# Patient Record
Sex: Male | Born: 1950
Health system: Southern US, Community
[De-identification: ages and names within clinical notes are randomized; demographics above are authoritative.]

## PROBLEM LIST (undated history)

## (undated) DIAGNOSIS — E785 Hyperlipidemia, unspecified: Secondary | ICD-10-CM

## (undated) DIAGNOSIS — C61 Malignant neoplasm of prostate: Secondary | ICD-10-CM

## (undated) DIAGNOSIS — I10 Essential (primary) hypertension: Secondary | ICD-10-CM

## (undated) HISTORY — DX: Essential (primary) hypertension: I10

## (undated) HISTORY — DX: Malignant neoplasm of prostate: C61

## (undated) HISTORY — DX: Hyperlipidemia, unspecified: E78.5

## (undated) HISTORY — PX: PROSTATECTOMY: SHX69

---

## 2008-01-21 ENCOUNTER — Encounter: Admission: RE | Admit: 2008-01-21 | Discharge: 2008-01-21 | Payer: Self-pay | Admitting: Unknown Physician Specialty

## 2008-03-15 ENCOUNTER — Encounter: Admission: RE | Admit: 2008-03-15 | Discharge: 2008-03-15 | Payer: Self-pay | Admitting: Unknown Physician Specialty

## 2008-04-22 ENCOUNTER — Encounter: Admission: RE | Admit: 2008-04-22 | Discharge: 2008-04-22 | Payer: Self-pay | Admitting: Unknown Physician Specialty

## 2008-05-04 ENCOUNTER — Ambulatory Visit: Payer: Self-pay | Admitting: Emergency Medicine

## 2008-05-04 LAB — CONVERTED CEMR LAB
CO2: 30 meq/L (ref 19–32)
Calcium: 9.6 mg/dL (ref 8.4–10.5)
GFR calc non Af Amer: 73 mL/min
Potassium: 3.9 meq/L (ref 3.5–5.1)
Sodium: 141 meq/L (ref 135–145)

## 2008-05-05 ENCOUNTER — Encounter: Admission: RE | Admit: 2008-05-05 | Discharge: 2008-05-05 | Payer: Self-pay | Admitting: Emergency Medicine

## 2008-05-12 ENCOUNTER — Ambulatory Visit: Payer: Self-pay | Admitting: Emergency Medicine

## 2010-10-27 IMAGING — CR DG CHEST 2V
2 series · 2 of 2 positions shown · non-contrast
Comparison: None

CLINICAL DATA: Cough for several weeks, right anterior lateral rib
pain

CHEST - 2 VIEW

[view not recorded (1 of 2)]
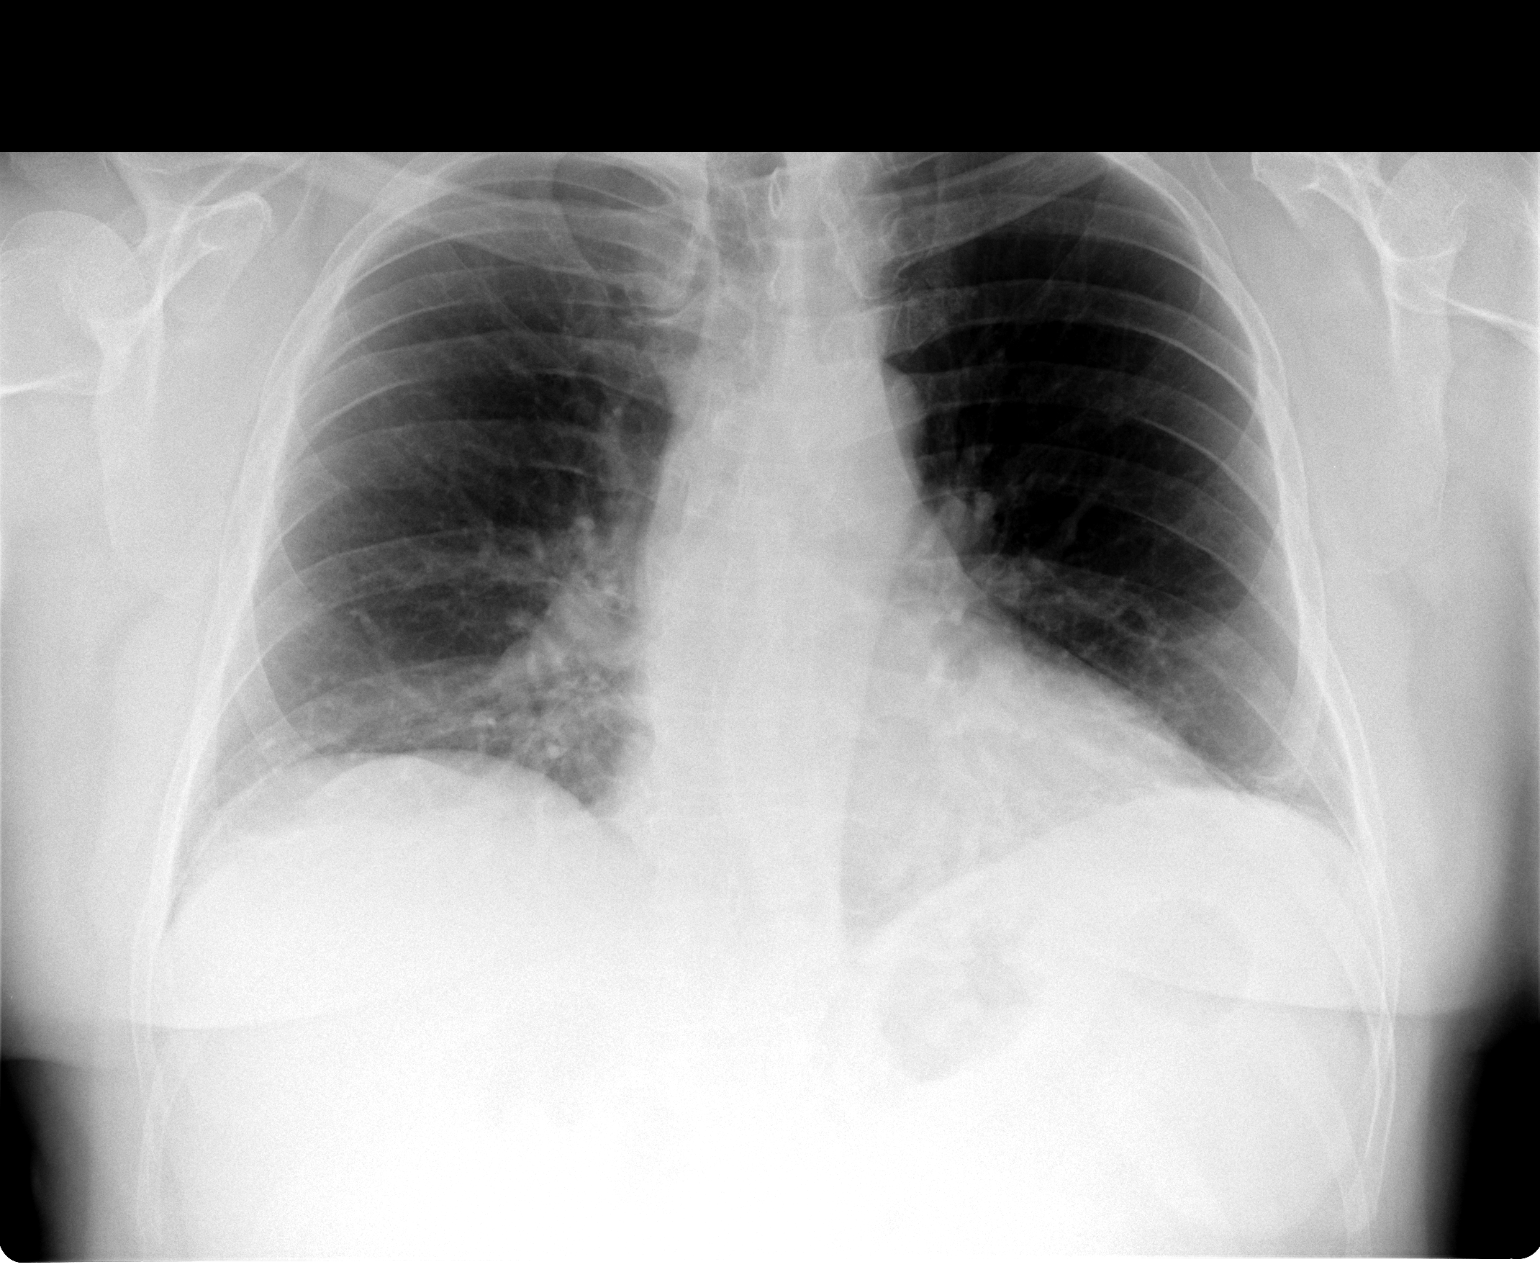

[view not recorded (2 of 2)]
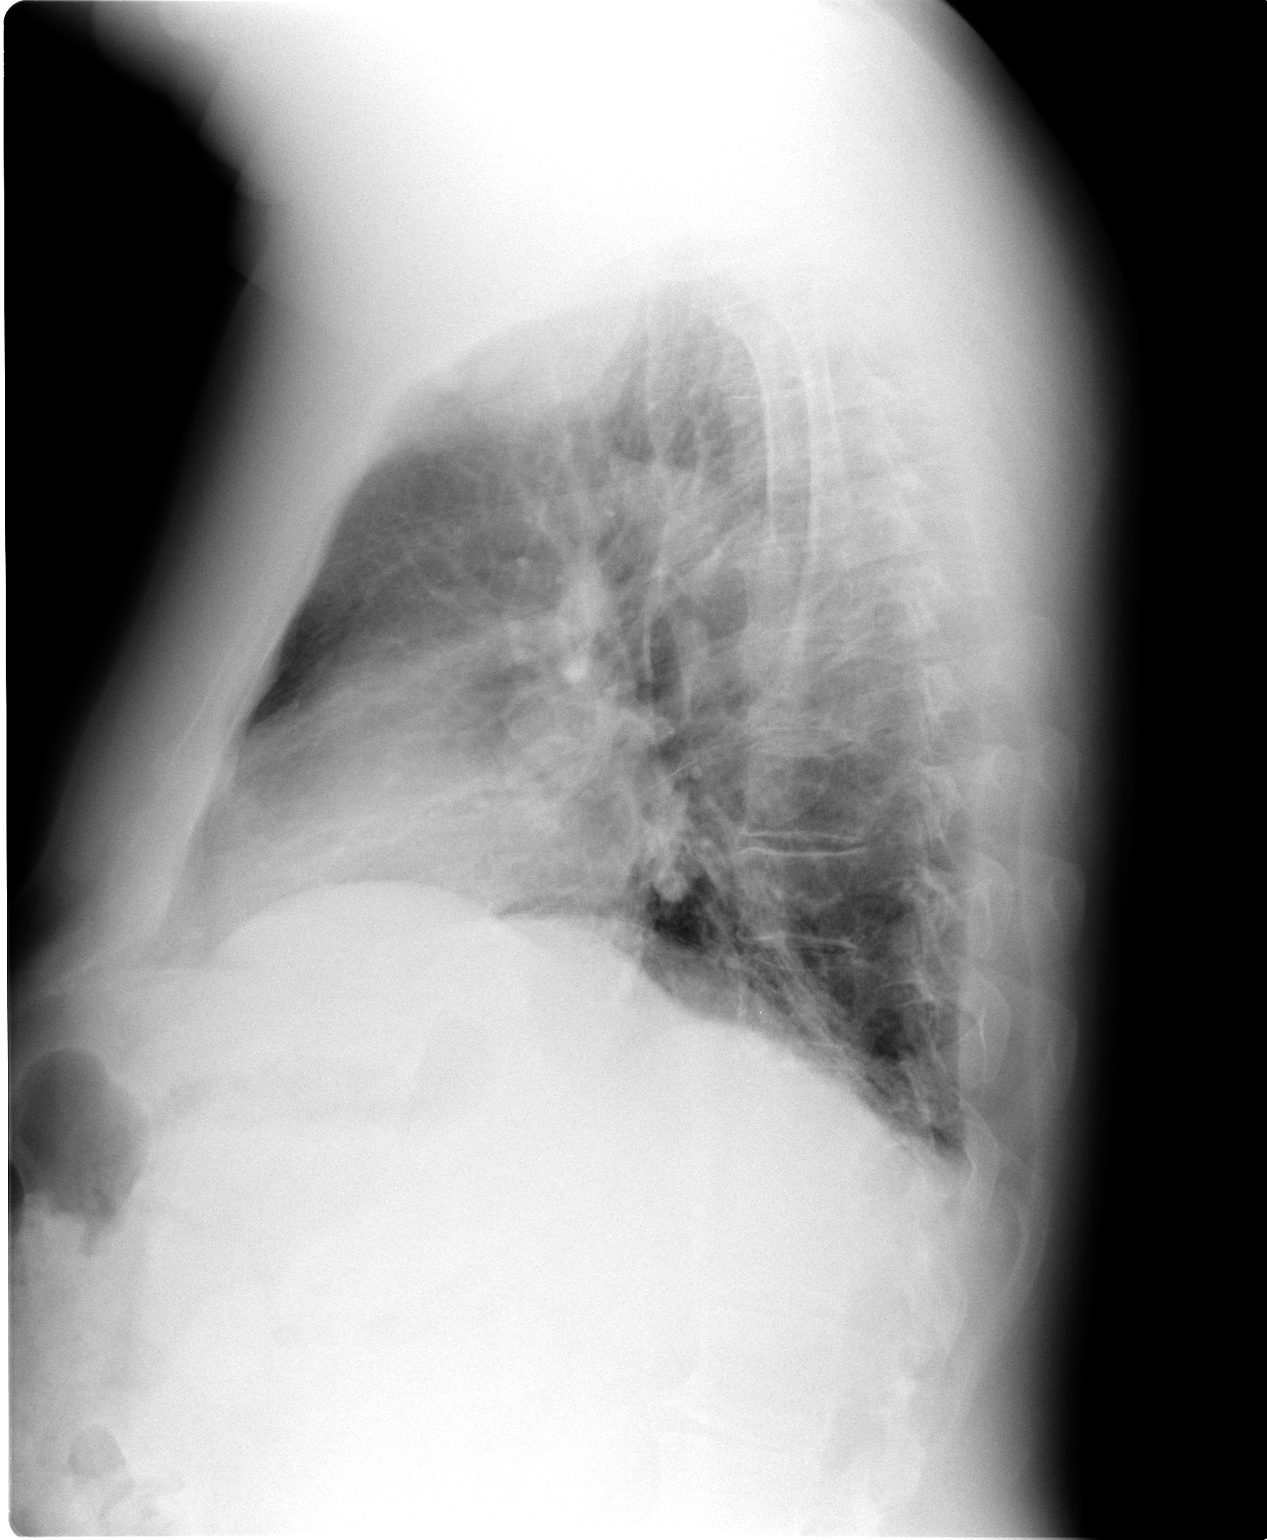

[2 of 2 positions shown; findings below may reference images not displayed]

FINDINGS: There are streaky markings posteriorly probably at the
left lung base.  Although these may be chronic in nature,
atelectasis or pneumonia cannot be excluded and follow-up chest x-
ray is recommended.  No pleural effusion is seen.  There is mild
cardiomegaly present.  No acute bony abnormality is noted.
IMPRESSION: Linear opacity posteriorly at the left lung base.  Consider
scarring, atelectasis, or pneumonia.  Follow-up chest x-ray may be
helpful.

## 2011-06-12 LAB — HEMOGLOBIN A1C: Hgb A1c MFr Bld: 5.6 % (ref 4.0–6.0)

## 2011-12-17 ENCOUNTER — Encounter: Payer: Self-pay | Admitting: Family Medicine

## 2011-12-17 ENCOUNTER — Ambulatory Visit (INDEPENDENT_AMBULATORY_CARE_PROVIDER_SITE_OTHER): Payer: BC Managed Care – PPO | Admitting: Family Medicine

## 2011-12-17 VITALS — BP 161/90 | HR 75 | Ht 66.0 in | Wt 204.0 lb

## 2011-12-17 DIAGNOSIS — Z23 Encounter for immunization: Secondary | ICD-10-CM

## 2011-12-17 DIAGNOSIS — J40 Bronchitis, not specified as acute or chronic: Secondary | ICD-10-CM

## 2011-12-17 DIAGNOSIS — Z8546 Personal history of malignant neoplasm of prostate: Secondary | ICD-10-CM

## 2011-12-17 DIAGNOSIS — Z298 Encounter for other specified prophylactic measures: Secondary | ICD-10-CM

## 2011-12-17 DIAGNOSIS — R0982 Postnasal drip: Secondary | ICD-10-CM | POA: Insufficient documentation

## 2011-12-17 MED ORDER — MOMETASONE FUROATE 50 MCG/ACT NA SUSP: 2.0000 | Freq: Every day | NASAL | Status: DC

## 2011-12-17 MED ORDER — ALBUTEROL SULFATE HFA 108 (90 BASE) MCG/ACT IN AERS: 2.0000 | INHALATION_SPRAY | Freq: Four times a day (QID) | RESPIRATORY_TRACT | Status: DC | PRN

## 2011-12-17 NOTE — Progress Notes (Signed)
CC: Blake Holden is a 61 y.o. male is here for Establish Care   Subjective: HPI:  Pleasant 61 year old here to establish care with acute complaint of a cough that's been present for a week that's is a nonproductive seems to be slightly improving but has been associated with wheezing. Denies fevers chills shortness of breath, orthopnea, nor chest pain. No interventions as of yet. Present 24 hours a day.  Past medical history significant for prostate cancer status post robotic prostatectomy. Denies unintentional weight loss. Managing erectile dysfunction with Cialis with good results.  Rare urinary incontinence with Valsalva does not get in the way of his quality of life. Denies gross hematuria or darkening of urine recently or remotely. Denies new musculoskeletal pain.  He is interested in losing weight has already made dietary changes but is unable to get in regular physical activity due to scheduling issues. he asks about hcg to aid in weight loss.  He believes his cholesterol was checked sometime around 6 months ago and is unsure about whether labs may have been obtained at that visit as well.  He notes that he did not take his blood pressure medication until minutes before the visit and is checking his blood pressure outside of our office in states the systolic today's reading is not characteristic of his daily blood pressure readings.   Review Of Systems Outlined In HPI  Past Medical History  Diagnosis Date  . Hypertension   . Hyperlipidemia   . Prostate cancer      Family History  Problem Relation Age of Onset  . Breast cancer Mother      History  Substance Use Topics  . Smoking status: Never Smoker   . Smokeless tobacco: Not on file  . Alcohol Use: Yes     Objective: Filed Vitals:   12/17/11 0913  BP: 161/90  Pulse: 75    General: Alert and Oriented, No Acute Distress HEENT: Pupils equal, round, reactive to light. Conjunctivae clear.  External ears unremarkable,  canals clear with intact TMs with appropriate landmarks.  Middle ear appears open without effusion. Pink inferior turbinates.  Moist mucous membranes, pharynx without inflammation nor lesions, moderate cobblestoning.  Neck supple without palpable lymphadenopathy nor abnormal masses. Lungs: Clear to auscultation bilaterally, no wheezing/ronchi/rales.  Comfortable work of breathing. Good air movement. Cardiac: Regular rate and rhythm. Normal S1/S2.  No murmurs, rubs, nor gallops.   Extremities: No peripheral edema.  Strong peripheral pulses.  Mental Status: No depression, anxiety, nor agitation. Skin: Warm and dry.  Assessment & Plan: Rual was seen today for establish care.  Diagnoses and associated orders for this visit:  Need for prophylactic immunotherapy - Flu vaccine greater than or equal to 3yo preservative free IM  History of prostate cancer - PSA  Bronchitis - albuterol (PROVENTIL HFA;VENTOLIN HFA) 108 (90 BASE) MCG/ACT inhaler; Inhale 2 puffs into the lungs every 6 (six) hours as needed for wheezing.  Postnasal drip - mometasone (NASONEX) 50 MCG/ACT nasal spray; Place 2 sprays into the nose daily.   We did PSA today is that over year since his last one progress report and his annual values have been less than 0.0 for years. He requests a albuterol for wheezing which I think is appropriate with his viral bronchitis that is resolving. Sounds like he is postnasal drip as well and was given Nasonex. We'll wait for outside labs for review prior to ordering labs before CPE in 3-4 weeks note that the patient has never had a colonoscopy  Return in about 3 weeks (around 01/07/2012).

## 2011-12-31 ENCOUNTER — Ambulatory Visit (INDEPENDENT_AMBULATORY_CARE_PROVIDER_SITE_OTHER): Payer: BC Managed Care – PPO | Admitting: Family Medicine

## 2011-12-31 ENCOUNTER — Encounter: Payer: Self-pay | Admitting: Family Medicine

## 2011-12-31 VITALS — BP 159/95 | HR 73 | Ht 66.0 in | Wt 206.0 lb

## 2011-12-31 DIAGNOSIS — Z0000 Encounter for general adult medical examination without abnormal findings: Secondary | ICD-10-CM

## 2011-12-31 DIAGNOSIS — J40 Bronchitis, not specified as acute or chronic: Secondary | ICD-10-CM

## 2011-12-31 DIAGNOSIS — I10 Essential (primary) hypertension: Secondary | ICD-10-CM

## 2011-12-31 LAB — BASIC METABOLIC PANEL WITH GFR
BUN: 23 mg/dL (ref 6–23)
CO2: 28 mEq/L (ref 19–32)
Calcium: 9.6 mg/dL (ref 8.4–10.5)
Chloride: 104 mEq/L (ref 96–112)
Potassium: 4.3 mEq/L (ref 3.5–5.3)
Sodium: 141 mEq/L (ref 135–145)

## 2011-12-31 LAB — LIPID PANEL
LDL Cholesterol: 112 mg/dL — ABNORMAL HIGH (ref 0–99)
Total CHOL/HDL Ratio: 4.4 Ratio
Triglycerides: 119 mg/dL (ref <150)
VLDL: 24 mg/dL (ref 0–40)

## 2011-12-31 MED ORDER — LOSARTAN POTASSIUM 100 MG PO TABS: 100.0000 mg | ORAL_TABLET | Freq: Every day | ORAL | Status: DC

## 2011-12-31 MED ORDER — ALBUTEROL SULFATE HFA 108 (90 BASE) MCG/ACT IN AERS: 2.0000 | INHALATION_SPRAY | Freq: Four times a day (QID) | RESPIRATORY_TRACT | Status: DC | PRN

## 2011-12-31 NOTE — Progress Notes (Signed)
CC: Blake Holden is a 61 y.o. male is here for Annual Exam  Colonoscopy: Patient refuses to have this done due to financial issues, we discussed the benefits of colonoscopy for the prevention and early detection of colon cancer, he will consider having this in the near future. Prostate: Discussed screening risks/beneifts with patient on 12/31/2011. PSA last week undetectable. AAA Screen: No smoking history not indicated DEXA: No known risk factors for osteopenia or osteoporosis Influenza Vaccine: Received 12/17/11 Pneumovax: No indication as of 12/31/2011 Td/Tdap: Believes within last 10 years Zoster: Not interested as of 12/31/2011    Subjective: HPI:  He has no acute complaints today  Blood pressure has been elevated in stage I hypertension today at his last visit.  He is trying to watch what he eats, he is just now getting back into a formal exercise program. He states his spirits are great denies depression, anxiety, mood disturbance  Review of Systems - General ROS: negative for - chills, fever, night sweats, weight gain or weight loss Ophthalmic ROS: negative for - decreased vision Psychological ROS: negative for - anxiety or depression ENT ROS: negative for - hearing change, nasal congestion, tinnitus or allergies Hematological and Lymphatic ROS: negative for - bleeding problems, bruising or swollen lymph nodes Breast ROS: negative Respiratory ROS: no cough, shortness of breath, or wheezing Cardiovascular ROS: no chest pain or dyspnea on exertion Gastrointestinal ROS: no abdominal pain, change in bowel habits, or black or bloody stools Genito-Urinary ROS: negative for - genital discharge, genital ulcers, incontinence or abnormal bleeding from genitals Musculoskeletal ROS: negative for - joint pain or muscle pain Neurological ROS: negative for - headaches or memory loss Dermatological ROS: negative for lumps, mole changes, rash and skin lesion changes  Past Medical History    Diagnosis Date  . Hypertension   . Hyperlipidemia   . Prostate cancer      Family History  Problem Relation Age of Onset  . Breast cancer Mother      History  Substance Use Topics  . Smoking status: Never Smoker   . Smokeless tobacco: Not on file  . Alcohol Use: Yes     Objective: Filed Vitals:   12/31/11 0912  BP: 159/95  Pulse: 73    General: No Acute Distress HEENT: Atraumatic, normocephalic, conjunctivae normal without scleral icterus.  No nasal discharge, hearing grossly intact, TMs with good landmarks bilaterally with no middle ear abnormalities, posterior pharynx clear without oral lesions. Neck: Supple, trachea midline, no cervical nor supraclavicular adenopathy. Pulmonary: Clear to auscultation bilaterally without wheezing, rhonchi, nor rales. Cardiac: Regular rate and rhythm.  No murmurs, rubs, nor gallops. No peripheral edema.  2+ peripheral pulses bilaterally. Abdomen: Bowel sounds normal.  No masses.  Non-tender without rebound.  Negative Murphy's sign. GU: Declined by patient MSK: Grossly intact, no signs of weakness.  Full strength throughout upper and lower extremities.  Full ROM in upper and lower extremities.  No midline spinal tenderness. Neuro: Gait unremarkable, CN II-XII grossly intact.  C5-C6 Reflex 2/4 Bilaterally, L4 Reflex 2/4 Bilaterally.  Cerebellar function intact. Skin: No rashes, numerous skin tags underneath the right axilla. Scattered actinic keratoses on the scalp and a few on the forearms bilaterally. Psych: Alert and oriented to person/place/time.  Thought process normal. No anxiety/depression.  Assessment & Plan: [name] was seen today for annual exam.  Diagnoses and associated orders for this visit:  Annual physical exam  Bronchitis - albuterol (PROVENTIL HFA;VENTOLIN HFA) 108 (90 BASE) MCG/ACT inhaler; Inhale 2 puffs into  the lungs every 6 (six) hours as needed for wheezing.  Routine health maintenance - Lipid panel - BASIC  METABOLIC PANEL WITH GFR  Essential hypertension - losartan (COZAAR) 100 MG tablet; Take 1 tablet (100 mg total) by mouth daily.     will increase his Cozaar, he will present for a nurse visit in approximately one week for blood pressure check. He's requested a cream is used in the past to treat actinic keratoses, he'll call us with the name of what he has used with success in the past. Labs as above, he tells me fasting sugars have been elevated in the past but is unsure of the exact numbers. I will call him with results tomorrow. He declines STI testing. We discussed healthy lifestyle options and a handout was given to him.  Return in about 3 months (around 04/01/2012).

## 2011-12-31 NOTE — Patient Instructions (Addendum)
Dr. Imajean Mcdermid's General Advice Following Your Complete Physical Exam  The Benefits of Regular Exercise: Unless you suffer from an uncontrolled cardiovascular condition, studies strongly suggest that regular exercise and physical activity will add to both the quality and length of your life.  The World Health Organization recommends 150 minutes of moderate intensity aerobic activity every week.  This is best split over 3-4 days a week, and can be as simple as a brisk walk for just over 35 minutes "most days of the week".  This type of exercise has been shown to lower LDL-Cholesterol, lower average blood sugars, lower blood pressure, lower cardiovascular disease risk, improve memory, and increase one's overall sense of wellbeing.  The addition of anaerobic (or "strength training") exercises offers additional benefits including but not limited to increased metabolism, prevention of osteoporosis, and improved overall cholesterol levels.  How Can I Strive For A Low-Fat Diet?: Current guidelines recommend that 25-35 percent of your daily energy (food) intake should come from fats.  One might ask how can this be achieved without having to dissect each meal on a daily basis?  Switch to skim or 1% milk instead of whole milk.  Focus on lean meats such as ground turkey, fresh fish, baked chicken, and lean cuts of beef as your source of dietary protein.  Consume less than 300mg/day of dietary cholesterol.  Limit trans fatty acid consumption primarily by limiting synthetic trans fats such as partially hydrogenated oils (Ex: fried fast foods).  Focus efforts on reducing your intake of "solid" fats (Ex: Butter).  Substitute olive or vegetable oil for solid fats where possible.  Moderation of Salt Intake: Provided you don't carry a diagnosis of congestive heart failure nor renal failure, I recommend a daily allowance of no more than 2300 mg of salt (sodium).  Keeping under this daily goal is associated with a  decreased risk of cardiovascular events, creeping above it can lead to elevated blood pressures and increases your risk of cardiovascular events.  Milligrams (mg) of salt is listed on all nutrition labels, and your daily intake can add up faster than you think.  Most canned and frozen dinners can pack in over half your daily salt allowance in one meal.    Lifestyle Health Risks: Certain lifestyle choices carry specific health risks.  As you may already know, tobacco use has been associated with increasing one's risk of cardiovascular disease, pulmonary disease, numerous cancers, among many other issues.  What you may not know is that there are medications and nicotine replacement strategies that can more than double your chances of successfully quitting.  I would be thrilled to help manage your quitting strategy if you currently use tobacco products.  When it comes to alcohol use, I've yet to find an "ideal" daily allowance.  Provided an individual does not have a medical condition that is exacerbated by alcohol consumption, general guidelines determine "safe drinking" as no more than two standard drinks for a man or no more than one standard drink for a male per day.  However, much debate still exists on whether any amount of alcohol consumption is technically "safe".  My general advice, keep alcohol consumption to a minimum for general health promotion.  If you or others believe that alcohol, tobacco, or recreational drug use is interfering with your life, I would be happy to provide confidential counseling regarding treatment options.  General "Over The Counter" Nutrition Advice: Postmenopausal women should aim for a daily calcium intake of 1200 mg, however a significant   portion of this might already be provided by diets including milk, yogurt, cheese, and other dairy products.  Vitamin D has been shown to help preserve bone density, prevent fatigue, and has even been shown to help reduce falls in the  elderly.  Ensuring a daily intake of 800 Units of Vitamin D is a good place to start to enjoy the above benefits, we can easily check your Vitamin D level to see if you'd potentially benefit from supplementation beyond 800 Units a day.  Folic Acid intake should be of particular concern to women of childbearing age.  Daily consumption of 400-800 mcg of Folic Acid is recommended to minimize the chance of spinal cord defects in a fetus should pregnancy occur.    For many adults, accidents still remain one of the most common culprits when it comes to cause of death.  Some of the simplest but most effective preventitive habits you can adopt include regular seatbelt use, proper helmet use, securing firearms, and regularly testing your smoke and carbon monoxide detectors.  Neelie Welshans B. Tanyah Debruyne DO [facility] [address], [address] [place], [place] [address] Phone: [phone]  

## 2012-01-01 ENCOUNTER — Telehealth: Payer: Self-pay | Admitting: *Deleted

## 2012-01-01 DIAGNOSIS — J329 Chronic sinusitis, unspecified: Secondary | ICD-10-CM

## 2012-01-01 MED ORDER — DOXYCYCLINE HYCLATE 100 MG PO TABS: ORAL_TABLET | ORAL | Status: AC

## 2012-01-01 NOTE — Telephone Encounter (Signed)
Pt called and states he is having left ear pain and ear feels clogged. Denies any fever.  Pt wants an abx called into the pharm. I did explain to pt that we typically do not rx abx without being evaluated but I would send a message to the provider since he was seen yesterday.Also let him know about urgent care since it is late in the day.Please advise if you would like to call something in for this pt. He uses [facility]

## 2012-01-01 NOTE — Telephone Encounter (Signed)
We discussed this briefly yesterday, I'm ok with sending in doxy (pcn allergy) to his pharmacy.

## 2012-01-03 ENCOUNTER — Encounter: Payer: Self-pay | Admitting: Family Medicine

## 2012-01-03 DIAGNOSIS — N529 Male erectile dysfunction, unspecified: Secondary | ICD-10-CM

## 2012-01-03 DIAGNOSIS — R739 Hyperglycemia, unspecified: Secondary | ICD-10-CM

## 2012-01-03 DIAGNOSIS — E785 Hyperlipidemia, unspecified: Secondary | ICD-10-CM

## 2012-01-15 ENCOUNTER — Other Ambulatory Visit: Payer: Self-pay | Admitting: *Deleted

## 2012-01-15 DIAGNOSIS — I10 Essential (primary) hypertension: Secondary | ICD-10-CM

## 2012-01-15 MED ORDER — LOSARTAN POTASSIUM 100 MG PO TABS: 100.0000 mg | ORAL_TABLET | Freq: Every day | ORAL | Status: DC

## 2012-01-15 MED ORDER — SIMVASTATIN 40 MG PO TABS: 40.0000 mg | ORAL_TABLET | Freq: Every evening | ORAL | Status: DC

## 2012-01-15 MED ORDER — DILTIAZEM HCL ER BEADS 300 MG PO CP24: 300.0000 mg | ORAL_CAPSULE | Freq: Every day | ORAL | Status: DC

## 2012-03-24 ENCOUNTER — Ambulatory Visit: Payer: BC Managed Care – PPO | Admitting: Family Medicine

## 2012-03-24 DIAGNOSIS — Z0289 Encounter for other administrative examinations: Secondary | ICD-10-CM

## 2012-04-15 ENCOUNTER — Other Ambulatory Visit: Payer: Self-pay | Admitting: Family Medicine

## 2012-04-22 ENCOUNTER — Other Ambulatory Visit: Payer: Self-pay | Admitting: Family Medicine

## 2012-04-22 DIAGNOSIS — N529 Male erectile dysfunction, unspecified: Secondary | ICD-10-CM

## 2012-05-08 ENCOUNTER — Ambulatory Visit (INDEPENDENT_AMBULATORY_CARE_PROVIDER_SITE_OTHER): Payer: BC Managed Care – PPO | Admitting: Family Medicine

## 2012-05-08 ENCOUNTER — Encounter: Payer: Self-pay | Admitting: Family Medicine

## 2012-05-08 VITALS — BP 160/95 | HR 78 | Temp 98.3°F | Ht 66.0 in | Wt 205.0 lb

## 2012-05-08 DIAGNOSIS — H6980 Other specified disorders of Eustachian tube, unspecified ear: Secondary | ICD-10-CM

## 2012-05-08 DIAGNOSIS — R0982 Postnasal drip: Secondary | ICD-10-CM

## 2012-05-08 DIAGNOSIS — H6981 Other specified disorders of Eustachian tube, right ear: Secondary | ICD-10-CM

## 2012-05-08 MED ORDER — SULFAMETHOXAZOLE-TRIMETHOPRIM 800-160 MG PO TABS: ORAL_TABLET | ORAL | Status: AC

## 2012-05-08 MED ORDER — MOMETASONE FUROATE 50 MCG/ACT NA SUSP: NASAL | Status: DC

## 2012-05-08 NOTE — Progress Notes (Signed)
CC: Blake Holden is a 62 y.o. male is here for ringing in right ear   Subjective: HPI:  Patient complains of right ear pain and ringing for the past 2-4 weeks. Symptoms are similar to that experienced in the fall that improved with nasal steroid and doxycycline. Symptoms fluctuate throughout the day that are not predictable. Symptoms are mild to moderate in severity. Describes the pressure as feeling as if he is going up or down in altitude gradually. He's also complaining of sensation of postnasal drip that has started along with the symptoms above. He denies dizziness, hearing loss, nor ear discharge. He denies sensory motor disturbances, fevers, chills, cough, nasal congestion, facial pain. No interventions as of yet   Review Of Systems Outlined In HPI  Past Medical History  Diagnosis Date  . Hypertension   . Hyperlipidemia   . Prostate cancer      Family History  Problem Relation Age of Onset  . Breast cancer Mother      History  Substance Use Topics  . Smoking status: Never Smoker   . Smokeless tobacco: Not on file  . Alcohol Use: Yes     Objective: Filed Vitals:   05/08/12 1149  BP: 160/95  Pulse: 78  Temp: 98.3 F (36.8 C)    General: Alert and Oriented, No Acute Distress HEENT: Pupils equal, round, reactive to light. Conjunctivae clear.  External ears unremarkable, canals clear with intact TMs with appropriate landmarks.  Middle ear appears open without effusion. Pink inferior turbinates.  Moist mucous membranes, pharynx with mild cobblestoning..  Neck supple without palpable lymphadenopathy nor abnormal masses. Neuro: Air conduction is greater than bone conduction bilaterally with tuning fork, Weber testing does not lateralize tuning forks. Tympanogram performed suggesting negative in your pressure. Lungs: Clear to auscultation bilaterally, no wheezing/ronchi/rales.  Comfortable work of breathing. Good air movement.   Extremities: No peripheral edema.  Strong  peripheral pulses.  Mental Status: No depression, anxiety, nor agitation. Skin: Warm and dry.  Assessment & Plan: [name] was seen today for ringing in right ear.  Diagnoses and associated orders for this visit:  Postnasal drip  Eustachian tube dysfunction, right - mometasone (NASONEX) 50 MCG/ACT nasal spray; Two sprays each nostril daily. - sulfamethoxazole-trimethoprim (SEPTRA DS) 800-160 MG per tablet; One by mouth twice a day for ten days.    Suspect symptoms are due to eustachian tube dysfunction, restart Nasonex. Patient is open to the idea of an antibiotic but refuses Z-Pak and is penicillin allergic. Return in 2 weeks for recheck and discussion of blood pressure, patient does not want to consider diuretic   Return in about 2 weeks (around 05/22/2012).

## 2012-05-15 ENCOUNTER — Other Ambulatory Visit: Payer: Self-pay | Admitting: Family Medicine

## 2012-05-22 ENCOUNTER — Encounter: Payer: Self-pay | Admitting: Family Medicine

## 2012-05-22 ENCOUNTER — Ambulatory Visit (INDEPENDENT_AMBULATORY_CARE_PROVIDER_SITE_OTHER): Payer: BC Managed Care – PPO | Admitting: Family Medicine

## 2012-05-22 VITALS — BP 149/84 | HR 76 | Wt 203.0 lb

## 2012-05-22 DIAGNOSIS — H9190 Unspecified hearing loss, unspecified ear: Secondary | ICD-10-CM

## 2012-05-22 DIAGNOSIS — H9191 Unspecified hearing loss, right ear: Secondary | ICD-10-CM

## 2012-05-22 DIAGNOSIS — I10 Essential (primary) hypertension: Secondary | ICD-10-CM

## 2012-05-22 DIAGNOSIS — H9201 Otalgia, right ear: Secondary | ICD-10-CM

## 2012-05-22 DIAGNOSIS — H9209 Otalgia, unspecified ear: Secondary | ICD-10-CM

## 2012-05-22 MED ORDER — HYDROCHLOROTHIAZIDE 25 MG PO TABS: ORAL_TABLET | ORAL | Status: DC

## 2012-05-22 NOTE — Progress Notes (Signed)
CC: Blake Holden is a 62 y.o. male is here for Follow-up   Subjective: HPI:  Follow up right ear pain: Patient continues to feel a sensation of fullness and a mild ringing of the right ear. He has noticed that he has had some mild hearing loss primarily an extremely low at extremely high frequencies. Symptoms have gotten no better or worse since starting Nasonex and a ten-day course of Bactrim. He describes the symptoms came on suddenly much within 24 hours approximately 2-1/2 weeks ago following a viral upper respiratory illness. He denies dizziness, motor sensory disturbances other than that above. He denies recent or remote ear trauma, he is unsure whether or not he has taken ototoxic antibiotics in the past.  He denies fevers, chills, sinus pressure, facial pain, runny nose, cough, sore throat, shortness of breath.  Followup hypertension: Patient is been more active and try to watch what he eats successfully losing some weight. Denies missed doses of antihypertensives. Denies headaches, chest pain, shortness of breath, irregular heartbeat, orthopnea, peripheral edema, nor motor or sensory disturbances other than that described above   Review Of Systems Outlined In HPI  Past Medical History  Diagnosis Date  . Hypertension   . Hyperlipidemia   . Prostate cancer      Family History  Problem Relation Age of Onset  . Breast cancer Mother      History  Substance Use Topics  . Smoking status: Never Smoker   . Smokeless tobacco: Not on file  . Alcohol Use: Yes     Objective: Filed Vitals:   05/22/12 0847  BP: 149/84  Pulse: 76    General: Alert and Oriented, No Acute Distress HEENT: Pupils equal, round, reactive to light. Conjunctivae clear.  External ears unremarkable, canals clear with intact TMs with appropriate landmarks.  Middle ear appears open without effusion. Pink inferior turbinates.  Moist mucous membranes, pharynx without inflammation nor lesions.  Neck supple without  palpable lymphadenopathy nor abnormal masses. Lungs: Clear to auscultation bilaterally, no wheezing/ronchi/rales.  Comfortable work of breathing. Good air movement. Cardiac: Regular rate and rhythm. Normal S1/S2.  No murmurs, rubs, nor gallops.   Extremities: No peripheral edema.  Strong peripheral pulses.  Mental Status: No depression, anxiety, nor agitation. Skin: Warm and dry.  Tympanometry on the right shows a flat peak centered appropriately with normal ear canal volume. Audiometry screening on the right shows inability to hear 4000 Hz and 500 Hz below 40 dB, on the left he has no trouble hearing 4000 Hz at 20 dB.  Assessment & Plan: Blake Holden was seen today for follow-up.  Diagnoses and associated orders for this visit:  Essential hypertension, benign - hydrochlorothiazide (HYDRODIURIL) 25 MG tablet; Start with half a tab daily for BP control, may increase to whole tab daily if not causing urinary side effects.  Hearing loss in right ear - Ambulatory referral to Audiology - Ambulatory referral to ENT  Otalgia of right ear - Ambulatory referral to ENT    Essential hypertension: Uncontrolled, patient is open to the idea of a trial on a diuretic such as HCTZ but did have quality of life issues with urinary frequency on this before and will let me know if he is intolerant to it again. Hearing loss in right ear with pain: Objective evidence of hearing loss today therefore we'll send for formal audiology followed by ENT referral/consultation for further evaluation/management.  Return in about 4 weeks (around 06/19/2012).

## 2012-05-26 ENCOUNTER — Encounter: Payer: Self-pay | Admitting: Family Medicine

## 2012-07-15 ENCOUNTER — Other Ambulatory Visit: Payer: Self-pay | Admitting: Family Medicine

## 2012-09-23 ENCOUNTER — Other Ambulatory Visit: Payer: Self-pay | Admitting: Family Medicine

## 2012-09-30 ENCOUNTER — Other Ambulatory Visit: Payer: Self-pay | Admitting: Family Medicine

## 2012-10-29 ENCOUNTER — Other Ambulatory Visit: Payer: Self-pay | Admitting: Family Medicine

## 2013-01-12 ENCOUNTER — Other Ambulatory Visit: Payer: Self-pay | Admitting: Family Medicine

## 2013-01-30 ENCOUNTER — Other Ambulatory Visit: Payer: Self-pay | Admitting: Family Medicine

## 2013-05-05 ENCOUNTER — Other Ambulatory Visit: Payer: Self-pay | Admitting: Family Medicine

## 2013-05-26 ENCOUNTER — Ambulatory Visit (INDEPENDENT_AMBULATORY_CARE_PROVIDER_SITE_OTHER): Payer: BC Managed Care – PPO

## 2013-05-26 ENCOUNTER — Encounter: Payer: Self-pay | Admitting: Sports Medicine

## 2013-05-26 ENCOUNTER — Ambulatory Visit (INDEPENDENT_AMBULATORY_CARE_PROVIDER_SITE_OTHER): Payer: BC Managed Care – PPO | Admitting: Sports Medicine

## 2013-05-26 VITALS — BP 149/82 | HR 90 | Ht 66.0 in | Wt 208.0 lb

## 2013-05-26 DIAGNOSIS — R059 Cough, unspecified: Secondary | ICD-10-CM

## 2013-05-26 DIAGNOSIS — R05 Cough: Secondary | ICD-10-CM

## 2013-05-26 MED ORDER — BENZONATATE 200 MG PO CAPS: 200.0000 mg | ORAL_CAPSULE | Freq: Three times a day (TID) | ORAL | Status: DC | PRN

## 2013-05-26 MED ORDER — MOXIFLOXACIN HCL 400 MG PO TABS: 400.0000 mg | ORAL_TABLET | Freq: Every day | ORAL | Status: DC

## 2013-05-26 NOTE — Assessment & Plan Note (Signed)
Persistent, on and off for several months now. Chest x-ray, Avelox, patient specifically requests not to be given azithromycin, Tessalon Perles. If cough persists, we certainly need to consider pre-and post bronchodilator spirometry and likely cross-sectional chest imaging.

## 2013-05-26 NOTE — Progress Notes (Signed)
  Subjective:    CC: Cough  HPI: This is a pleasant 63 year old male, for the past several months he's noted an on-and-off cough with mild shortness of breath, no wheezing, he is a nonsmoker, no constitutional symptoms. Cough is moderate, persistent.  Past medical history, Surgical history, Family history not pertinant except as noted below, Social history, Allergies, and medications have been entered into the medical record, reviewed, and no changes needed.   Review of Systems: No fevers, chills, night sweats, weight loss, chest pain, or shortness of breath.   Objective:    General: Well Developed, well nourished, and in no acute distress.  Neuro: Alert and oriented x3, extra-ocular muscles intact, sensation grossly intact.  HEENT: Normocephalic, atraumatic, pupils equal round reactive to light, neck supple, no masses, no lymphadenopathy, thyroid nonpalpable.  Skin: Warm and dry, no rashes. Cardiac: Regular rate and rhythm, no murmurs rubs or gallops, no lower extremity edema.  Respiratory: Clear to auscultation bilaterally. Not using accessory muscles, speaking in full sentences.  CXR negative  Impression and Recommendations:

## 2013-06-09 ENCOUNTER — Ambulatory Visit: Payer: BC Managed Care – PPO | Admitting: Sports Medicine

## 2013-07-13 ENCOUNTER — Other Ambulatory Visit: Payer: Self-pay | Admitting: Family Medicine

## 2013-10-12 ENCOUNTER — Other Ambulatory Visit: Payer: Self-pay | Admitting: Family Medicine

## 2013-11-13 ENCOUNTER — Other Ambulatory Visit: Payer: Self-pay | Admitting: Family Medicine

## 2013-11-17 ENCOUNTER — Encounter: Payer: Self-pay | Admitting: Family Medicine

## 2013-11-17 ENCOUNTER — Ambulatory Visit (INDEPENDENT_AMBULATORY_CARE_PROVIDER_SITE_OTHER): Payer: BC Managed Care – PPO | Admitting: Family Medicine

## 2013-11-17 VITALS — BP 174/92 | HR 62 | Ht 66.0 in | Wt 203.0 lb

## 2013-11-17 DIAGNOSIS — Z23 Encounter for immunization: Secondary | ICD-10-CM | POA: Diagnosis not present

## 2013-11-17 DIAGNOSIS — Z5181 Encounter for therapeutic drug level monitoring: Secondary | ICD-10-CM

## 2013-11-17 DIAGNOSIS — I10 Essential (primary) hypertension: Secondary | ICD-10-CM | POA: Diagnosis not present

## 2013-11-17 DIAGNOSIS — R7309 Other abnormal glucose: Secondary | ICD-10-CM | POA: Diagnosis not present

## 2013-11-17 DIAGNOSIS — E785 Hyperlipidemia, unspecified: Secondary | ICD-10-CM

## 2013-11-17 DIAGNOSIS — Z79899 Other long term (current) drug therapy: Secondary | ICD-10-CM

## 2013-11-17 DIAGNOSIS — R739 Hyperglycemia, unspecified: Secondary | ICD-10-CM

## 2013-11-17 DIAGNOSIS — Z8546 Personal history of malignant neoplasm of prostate: Secondary | ICD-10-CM

## 2013-11-17 MED ORDER — SIMVASTATIN 40 MG PO TABS: ORAL_TABLET | ORAL | Status: DC

## 2013-11-17 MED ORDER — LOSARTAN POTASSIUM 100 MG PO TABS: ORAL_TABLET | ORAL | Status: DC

## 2013-11-17 MED ORDER — DILTIAZEM HCL ER COATED BEADS 300 MG PO CP24: ORAL_CAPSULE | ORAL | Status: DC

## 2013-11-17 MED ORDER — HYDROCHLOROTHIAZIDE 25 MG PO TABS: ORAL_TABLET | ORAL | Status: DC

## 2013-11-17 NOTE — Progress Notes (Signed)
CC: Blake Holden is a 63 y.o. male is here for Medication Refill   Subjective: HPI:  Followup elevated blood sugar: He urinates once at night and denies any other polyuria polyphasia or polydipsia. No dietary or exercise changes since I saw him last. Admits to over indulgence with calories  Followup hyperlipidemia: Continues on Zocor on a daily basis without noted side effects or intolerance. Denies right upper quadrant pain nor myalgias. No chest pain or limb claudication  Followup hypertension: No outside blood pressures to report he's been taking diltiazem and losartan but did not take more than 30 days of hydrochlorothiazide. He reports he stopped taking his due to forgetting to ask for refills. Denies shortness of breath orthopnea peripheral edema nor any motor or sensory disturbances  History of prostate cancer. He denies any urinary hesitancy, urgency nor incontinence.   Review Of Systems Outlined In HPI  Past Medical History  Diagnosis Date  . Hypertension   . Hyperlipidemia   . Prostate cancer     Past Surgical History  Procedure Laterality Date  . Prostatectomy     Family History  Problem Relation Age of Onset  . Breast cancer Mother     History   Social History  . Marital Status: Married    Spouse Name: N/A    Number of Children: N/A  . Years of Education: N/A   Occupational History  . Not on file.   Social History Main Topics  . Smoking status: Never Smoker   . Smokeless tobacco: Not on file  . Alcohol Use: Yes  . Drug Use: No  . Sexual Activity: Yes   Other Topics Concern  . Not on file   Social History Narrative  . No narrative on file     Objective: BP 174/92  Pulse 62  Ht 5\' 6"  (1.676 m)  Wt 203 lb (92.08 kg)  BMI 32.78 kg/m2  General: Alert and Oriented, No Acute Distress HEENT: Pupils equal, round, reactive to light. Conjunctivae clear.  Moist mucous membranes pharynx unremarkable Lungs: Clear to auscultation bilaterally, no  wheezing/ronchi/rales.  Comfortable work of breathing. Good air movement. Cardiac: Regular rate and rhythm. Normal S1/S2.  No murmurs, rubs, nor gallops.   Abdomen: Obese and soft Extremities: No peripheral edema.  Strong peripheral pulses.  Mental Status: No depression, anxiety, nor agitation. Skin: Warm and dry.  Assessment & Plan: Blake Holden was seen today for medication refill.  Diagnoses and associated orders for this visit:  Elevated blood sugar - COMPLETE METABOLIC PANEL WITH GFR  Hyperlipidemia - Lipid panel  HYPERTENSION - COMPLETE METABOLIC PANEL WITH GFR  History of prostate cancer - PSA  Encounter for monitoring statin therapy - COMPLETE METABOLIC PANEL WITH GFR  Need for prophylactic vaccination and inoculation against influenza  Other Orders - diltiazem (CARTIA XT) 300 MG 24 hr capsule; TAKE ONE CAPSULE BY MOUTH EVERY DAY - losartan (COZAAR) 100 MG tablet; TAKE ONE TABLET BY MOUTH EVERY DAY - hydrochlorothiazide (HYDRODIURIL) 25 MG tablet; TAKE 1/2 TAB FOR B.P. CONTROL. MAY INCREASE TO 1 TAB DAILY IF NO URINARY SIDE EFFECTS. - simvastatin (ZOCOR) 40 MG tablet; TAKE ONE TABLET BY MOUTH EVERY EVENING    Elevated blood sugar: Checking fasting glucose today Hyperlipidemia: Continue Zocor pending liver enzymes and lipid panel Hypertension: Uncontrolled continue losartan and diltiazem, restart hydrochlorothiazide History of prostate cancer: Checking PSA  Return in about 4 weeks (around 12/15/2013) for BP recheck.

## 2013-11-18 LAB — COMPLETE METABOLIC PANEL WITH GFR
ALT: 28 U/L (ref 0–53)
AST: 17 U/L (ref 0–37)
Albumin: 4.3 g/dL (ref 3.5–5.2)
Alkaline Phosphatase: 73 U/L (ref 39–117)
BUN: 17 mg/dL (ref 6–23)
CO2: 25 meq/L (ref 19–32)
Calcium: 9.5 mg/dL (ref 8.4–10.5)
Chloride: 108 mEq/L (ref 96–112)
Creat: 1.01 mg/dL (ref 0.50–1.35)
GFR, Est Non African American: 79 mL/min
Glucose, Bld: 115 mg/dL — ABNORMAL HIGH (ref 70–99)
Potassium: 4 mEq/L (ref 3.5–5.3)
SODIUM: 143 meq/L (ref 135–145)
TOTAL PROTEIN: 6.9 g/dL (ref 6.0–8.3)
Total Bilirubin: 0.6 mg/dL (ref 0.2–1.2)

## 2013-11-18 LAB — LIPID PANEL
Cholesterol: 182 mg/dL (ref 0–200)
HDL: 48 mg/dL (ref >39)
LDL Cholesterol: 115 mg/dL — ABNORMAL HIGH (ref 0–99)
Total CHOL/HDL Ratio: 3.8 Ratio
Triglycerides: 93 mg/dL (ref <150)
VLDL: 19 mg/dL (ref 0–40)

## 2013-11-18 LAB — PSA: PSA: <0.01 ng/mL (ref <=4.00)

## 2013-12-15 ENCOUNTER — Ambulatory Visit (INDEPENDENT_AMBULATORY_CARE_PROVIDER_SITE_OTHER): Payer: BC Managed Care – PPO | Admitting: Family Medicine

## 2013-12-15 ENCOUNTER — Encounter: Payer: Self-pay | Admitting: Family Medicine

## 2013-12-15 VITALS — BP 135/85 | HR 64 | Ht 66.0 in | Wt 204.0 lb

## 2013-12-15 DIAGNOSIS — I10 Essential (primary) hypertension: Secondary | ICD-10-CM | POA: Diagnosis not present

## 2013-12-15 DIAGNOSIS — L570 Actinic keratosis: Secondary | ICD-10-CM | POA: Diagnosis not present

## 2013-12-15 DIAGNOSIS — N5231 Erectile dysfunction following radical prostatectomy: Secondary | ICD-10-CM

## 2013-12-15 MED ORDER — FLUOROURACIL 5 % EX CREA: TOPICAL_CREAM | CUTANEOUS | Status: DC

## 2013-12-15 MED ORDER — AVANAFIL 100 MG PO TABS: ORAL_TABLET | ORAL | Status: DC

## 2013-12-15 NOTE — Progress Notes (Signed)
CC: Blake Holden is a 62 y.o. male is here for Hypertension   Subjective: HPI:  Followup essential hypertension: Since I saw him last he's been taking a full dose of hydrochlorothiazide on a daily basis with one to 2 extra urinations a day. He denies any other side effects. Side effects do not the way of his quality of life. No outside blood pressures reported. No chest pain shortness of breath orthopnea nor peripheral edema  Followup erectile dysfunction: He's wondering if there is anything on the market other than Cialis or Viagra. His difficulty maintaining and initiating erection since a radical prostatectomy many years ago. Viagra or Cialis is only moderately effective. Nothing else particularly makes symptoms better or worse. There present on a daily basis  Complains of flaking on the top of his scalp. He has used fluorouracil in the past with good success. He's asking for refill of this. Denies any skin changes elsewhere  Review Of Systems Outlined In HPI  Past Medical History  Diagnosis Date  . Hypertension   . Hyperlipidemia   . Prostate cancer     Past Surgical History  Procedure Laterality Date  . Prostatectomy     Family History  Problem Relation Age of Onset  . Breast cancer Mother     History   Social History  . Marital Status: Married    Spouse Name: N/A    Number of Children: N/A  . Years of Education: N/A   Occupational History  . Not on file.   Social History Main Topics  . Smoking status: Never Smoker   . Smokeless tobacco: Not on file  . Alcohol Use: Yes  . Drug Use: No  . Sexual Activity: Yes   Other Topics Concern  . Not on file   Social History Narrative  . No narrative on file     Objective: BP 135/85  Pulse 64  Ht 5\' 6"  (1.676 m)  Wt 204 lb (92.534 kg)  BMI 32.94 kg/m2  Vital signs reviewed. General: Alert and Oriented, No Acute Distress HEENT: Pupils equal, round, reactive to light. Conjunctivae clear.  External ears unremarkable.   Moist mucous membranes. Lungs: Clear and comfortable work of breathing, speaking in full sentences without accessory muscle use. Cardiac: Regular rate and rhythm.  Extremities: No peripheral edema.  Strong peripheral pulses.  Mental Status: No depression, anxiety, nor agitation. Logical though process. Skin: Warm and dry. 4 mild actinic keratosis spots on the scalp most problematic on the midline anterior aspect of the head.  Assessment & Plan: Lorraine was seen today for hypertension.  Diagnoses and associated orders for this visit:  Essential hypertension  Actinic keratosis - fluorouracil (EFUDEX) 5 % cream; Apply to precancerous spots on head daily for two weeks.  Erectile dysfunction following radical prostatectomy - Avanafil (STENDRA) 100 MG TABS; Half to full tab by mouth daily only as needed for ED.    Essential hypertension: Improving control continue hydrochlorothiazide, diltiazem, losartan Actinic keratosis: Provided  Fluorouracil if any remaining lesions are present one month return for cryotherapy Erectile dysfunction: Uncontrolled, trial of Stendra, not to take cialis on the same day. Whichever works best refills can be provided in the future    Return in about 3 months (around 03/17/2014).

## 2014-01-19 ENCOUNTER — Other Ambulatory Visit: Payer: Self-pay | Admitting: Family Medicine

## 2014-02-17 ENCOUNTER — Other Ambulatory Visit: Payer: Self-pay | Admitting: Family Medicine

## 2014-02-22 ENCOUNTER — Encounter: Payer: Self-pay | Admitting: Family Medicine

## 2014-02-22 ENCOUNTER — Ambulatory Visit (INDEPENDENT_AMBULATORY_CARE_PROVIDER_SITE_OTHER): Payer: BC Managed Care – PPO | Admitting: Family Medicine

## 2014-02-22 VITALS — BP 150/92 | HR 77 | Temp 98.2°F | Wt 204.0 lb

## 2014-02-22 DIAGNOSIS — J209 Acute bronchitis, unspecified: Secondary | ICD-10-CM

## 2014-02-22 MED ORDER — DOXYCYCLINE HYCLATE 100 MG PO TABS: ORAL_TABLET | ORAL | Status: AC

## 2014-02-22 NOTE — Progress Notes (Signed)
CC: Blake Holden is a 63 y.o. male is here for URI   Subjective: HPI:  Complains of mildly productive cough that has been present on a daily basis for the last 3 weeks. Symptoms have been persistent since onset. Slightly improved with Mucinex, has also been using albuterol without much benefit. Symptoms are present all hours of the day. Nothing seems to make symptoms worse. He denies nasal congestion, postnasal drip, shortness of breath, wheezing, fever, chills, exertional chest pain, confusion, nor sore throat. He still able to swim 30 minutes most days of the week without any new shortness of breath or chest discomfort    Review Of Systems Outlined In HPI  Past Medical History  Diagnosis Date  . Hypertension   . Hyperlipidemia   . Prostate cancer     Past Surgical History  Procedure Laterality Date  . Prostatectomy     Family History  Problem Relation Age of Onset  . Breast cancer Mother     History   Social History  . Marital Status: Married    Spouse Name: N/A    Number of Children: N/A  . Years of Education: N/A   Occupational History  . Not on file.   Social History Main Topics  . Smoking status: Never Smoker   . Smokeless tobacco: Not on file  . Alcohol Use: Yes  . Drug Use: No  . Sexual Activity: Yes   Other Topics Concern  . Not on file   Social History Narrative     Objective: BP 150/92 mmHg  Pulse 77  Temp(Src) 98.2 F (36.8 C) (Oral)  Wt 204 lb (92.534 kg)  SpO2 95%  General: Alert and Oriented, No Acute Distress HEENT: Pupils equal, round, reactive to light. Conjunctivae clear.  External ears unremarkable, canals clear with intact TMs with appropriate landmarks.  Middle ear appears open without effusion. Pink inferior turbinates.  Moist mucous membranes, pharynx without inflammation nor lesions.  Neck supple without palpable lymphadenopathy nor abnormal masses. Lungs: Clear to auscultation bilaterally, no wheezing/ronchi/rales.  Comfortable work  of breathing. Good air movement. Cardiac: Regular rate and rhythm. Normal S1/S2.  No murmurs, rubs, nor gallops.   Mental Status: No depression, anxiety, nor agitation. Skin: Warm and dry.  Assessment & Plan: Leandre was seen today for uri.  Diagnoses and associated orders for this visit:  Acute bronchitis, unspecified organism - doxycycline (VIBRA-TABS) 100 MG tablet; One by mouth twice a day for ten days.    Bronchitis: Start doxycycline continue Mucinex, discussed stopping albuterol because there is no real indication at this point and it wasn't improving his subjective symptoms anyway. Asked him to call me if no better by Thursday next step would be prednisone.   Return if symptoms worsen or fail to improve.

## 2014-03-02 ENCOUNTER — Encounter: Payer: Self-pay | Admitting: Family Medicine

## 2014-03-02 ENCOUNTER — Ambulatory Visit (INDEPENDENT_AMBULATORY_CARE_PROVIDER_SITE_OTHER): Payer: BC Managed Care – PPO | Admitting: Family Medicine

## 2014-03-02 VITALS — BP 140/80 | HR 76 | Temp 98.1°F | Wt 208.0 lb

## 2014-03-02 DIAGNOSIS — R05 Cough: Secondary | ICD-10-CM

## 2014-03-02 DIAGNOSIS — R059 Cough, unspecified: Secondary | ICD-10-CM

## 2014-03-02 MED ORDER — METHYLPREDNISOLONE (PAK) 4 MG PO TABS: ORAL_TABLET | ORAL | Status: DC

## 2014-03-02 NOTE — Progress Notes (Signed)
CC: Blake Holden is a 63 y.o. male is here for chest congestion   Subjective: HPI:  2 days after taking doxycycline his cough began to improve however is still mild to moderate in severity and seems to have plateaued since the weekend. No new interventions. No new symptoms. Continues to deny any fevers, chills, shortness of breath, wheezing, blood in sputum, nasal congestion sore throat or postnasal drip.   Review Of Systems Outlined In HPI  Past Medical History  Diagnosis Date  . Hypertension   . Hyperlipidemia   . Prostate cancer     Past Surgical History  Procedure Laterality Date  . Prostatectomy     Family History  Problem Relation Age of Onset  . Breast cancer Mother     History   Social History  . Marital Status: Married    Spouse Name: N/A    Number of Children: N/A  . Years of Education: N/A   Occupational History  . Not on file.   Social History Main Topics  . Smoking status: Never Smoker   . Smokeless tobacco: Not on file  . Alcohol Use: Yes  . Drug Use: No  . Sexual Activity: Yes   Other Topics Concern  . Not on file   Social History Narrative     Objective: BP 140/80 mmHg  Pulse 76  Temp(Src) 98.1 F (36.7 C)  Wt 208 lb (94.348 kg)  SpO2 97%  General: Alert and Oriented, No Acute Distress HEENT: Pupils equal, round, reactive to light. Conjunctivae clear.  External ears unremarkable, canals clear with intact TMs with appropriate landmarks.  Middle ear appears open without effusion. Pink inferior turbinates.  Moist mucous membranes, pharynx without inflammation nor lesions.  Neck supple without palpable lymphadenopathy nor abnormal masses. Lungs: Clear to auscultation bilaterally, no wheezing/ronchi/rales.  Comfortable work of breathing. Good air movement. Extremities: No peripheral edema.  Strong peripheral pulses.  Mental Status: No depression, anxiety, nor agitation. Skin: Warm and dry.  Assessment & Plan: Deuce was seen today for chest  congestion.  Diagnoses and associated orders for this visit:  Cough - methylPREDNIsolone (MEDROL DOSPACK) 4 MG tablet; follow package directions    Reassurance provided that his cough should resolve on its own in another week and it is not uncommon for cough to linger. He would like to know if there is any way we can be more aggressive to help reduce the cough therefore methylprednisone was provided and discussed warm beverages and honey to help soothe the throat..Signs and symptoms requring emergent/urgent reevaluation were discussed with the patient.   Return if symptoms worsen or fail to improve.

## 2014-03-17 ENCOUNTER — Ambulatory Visit: Payer: BC Managed Care – PPO | Admitting: Family Medicine

## 2014-03-17 DIAGNOSIS — Z0289 Encounter for other administrative examinations: Secondary | ICD-10-CM

## 2014-04-19 ENCOUNTER — Other Ambulatory Visit: Payer: Self-pay | Admitting: Family Medicine

## 2014-05-18 ENCOUNTER — Other Ambulatory Visit: Payer: Self-pay | Admitting: Family Medicine

## 2014-05-24 ENCOUNTER — Encounter: Payer: Self-pay | Admitting: Family Medicine

## 2014-05-24 ENCOUNTER — Ambulatory Visit (INDEPENDENT_AMBULATORY_CARE_PROVIDER_SITE_OTHER): Payer: BLUE CROSS/BLUE SHIELD | Admitting: Family Medicine

## 2014-05-24 VITALS — BP 130/88 | HR 84 | Wt 211.0 lb

## 2014-05-24 DIAGNOSIS — R42 Dizziness and giddiness: Secondary | ICD-10-CM | POA: Diagnosis not present

## 2014-05-24 NOTE — Progress Notes (Signed)
CC: Blake Holden is a 64 y.o. male is here for dizziness at times   Subjective: HPI:  Complains of dizziness that began 2-3 weeks ago described as a lightheadedness that he suddenly when he was up walking around. Symptoms would disappear within seconds after he would sit down. Nothing else seemed to make the symptoms better or worse. It happened only a few times a week. Over the past week though its completely gone away. He's also noticed a bleeding hemorrhoid that he's been dealing with for years now seem to be worse around the time of the dizziness and the severity of the dizziness drastically improved as the bleeding drastically resolved over the course of the day. He denies any other motor or sensory disturbances. No changes to medications. He tells me that he is embarrassed about how much he was actually bleeding from his rectum and never brought it to our attention. Since he stopped bleeding he's been taking the iron supplement in a children's multivitamin. 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 Thankfully he was checking his heartbeat when he was having the dizziness symptoms and never had what he would consider an irregular heartbeat. Denies shortness of breath, orthopnea, peripheral edema, nor confusion.   Review Of Systems Outlined In HPI  Past Medical History  Diagnosis Date  . Hypertension   . Hyperlipidemia   . Prostate cancer     Past Surgical History  Procedure Laterality Date  . Prostatectomy     Family History  Problem Relation Age of Onset  . Breast cancer Mother     History   Social History  . Marital Status: Married    Spouse Name: N/A  . Number of Children: N/A  . Years of Education: N/A   Occupational History  . Not on file.   Social History Main Topics  . Smoking status: Never Smoker   . Smokeless tobacco: Not on file  .  Alcohol Use: Yes  . Drug Use: No  . Sexual Activity: Yes   Other Topics Concern  . Not on file   Social History Narrative     Objective: BP 130/88 mmHg  Pulse 84  Wt 211 lb (95.709 kg)  General: Alert and Oriented, No Acute Distress HEENT: Pupils equal, round, reactive to light. Conjunctivae clear.  External ears unremarkable, canals clear with intact TMs with appropriate landmarks.  Middle ear appears open without effusion. Pink inferior turbinates.  Moist mucous membranes, pharynx without inflammation nor lesions.  Neck supple without palpable lymphadenopathy nor abnormal masses. Lungs: Clear to auscultation bilaterally, no wheezing/ronchi/rales.  Comfortable work of breathing. Good air movement. No carotid bruits Cardiac: Regular rate and rhythm. Normal S1/S2.  No murmurs, rubs, nor gallops.   Extremities: No peripheral edema.  Strong peripheral pulses.  Mental Status: No depression, anxiety, nor agitation. Skin: Warm and dry.  Assessment & Plan: Blake Holden was seen today for dizziness at times.  Diagnoses and all orders for this visit:  Dizziness  Dizziness: No red flags for neuroimaging. I can only assume that he was dealing with either bpv or anemia. Since he is feeling better he would prefer to not have a blood draw to check for hemoglobin. He does agree to return for blood work if any symptoms return. We also discussed using psyllium powder on a daily basis to prevent recurrence of his internal hemorrhoid.  Return if  symptoms worsen or fail to improve.

## 2014-06-17 ENCOUNTER — Other Ambulatory Visit: Payer: Self-pay | Admitting: Family Medicine

## 2014-06-18 ENCOUNTER — Other Ambulatory Visit: Payer: Self-pay | Admitting: Family Medicine

## 2014-08-31 ENCOUNTER — Ambulatory Visit (INDEPENDENT_AMBULATORY_CARE_PROVIDER_SITE_OTHER): Payer: BLUE CROSS/BLUE SHIELD | Admitting: Family Medicine

## 2014-08-31 ENCOUNTER — Encounter: Payer: Self-pay | Admitting: Family Medicine

## 2014-08-31 VITALS — BP 137/81 | HR 74 | Wt 208.0 lb

## 2014-08-31 DIAGNOSIS — E785 Hyperlipidemia, unspecified: Secondary | ICD-10-CM

## 2014-08-31 DIAGNOSIS — I10 Essential (primary) hypertension: Secondary | ICD-10-CM | POA: Diagnosis not present

## 2014-08-31 DIAGNOSIS — R55 Syncope and collapse: Secondary | ICD-10-CM | POA: Diagnosis not present

## 2014-08-31 DIAGNOSIS — N5231 Erectile dysfunction following radical prostatectomy: Secondary | ICD-10-CM | POA: Diagnosis not present

## 2014-08-31 DIAGNOSIS — H9191 Unspecified hearing loss, right ear: Secondary | ICD-10-CM

## 2014-08-31 MED ORDER — ATOVAQUONE-PROGUANIL HCL 250-100 MG PO TABS: 1.0000 | ORAL_TABLET | Freq: Every day | ORAL | Status: DC

## 2014-08-31 MED ORDER — TADALAFIL 5 MG PO TABS: 5.0000 mg | ORAL_TABLET | Freq: Every day | ORAL | Status: DC

## 2014-08-31 MED ORDER — DILTIAZEM HCL ER COATED BEADS 300 MG PO CP24: 300.0000 mg | ORAL_CAPSULE | Freq: Every day | ORAL | Status: DC

## 2014-08-31 MED ORDER — SIMVASTATIN 40 MG PO TABS: 40.0000 mg | ORAL_TABLET | Freq: Every evening | ORAL | Status: DC

## 2014-08-31 MED ORDER — LOSARTAN POTASSIUM-HCTZ 100-25 MG PO TABS: 1.0000 | ORAL_TABLET | Freq: Every day | ORAL | Status: DC

## 2014-08-31 NOTE — Progress Notes (Signed)
CC: Blake Holden is a 64 y.o. male is here for medication refills   Subjective: HPI:  Follow-up hyperlipidemia: Requesting refills on atorvastatin. 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 He denies any chest pain orthopnea and no shortness of breath. Denies any myalgias or right upper quadrant pain.  Follow-up erectile dysfunction: Requesting refills on Cialis. He is taking this daily and denies any difficulty with initiating or maintaining an erection while on this medication. He denies any known side effects  Follow-up essential hypertension: Requesting refills on diltiazem, hydrochlorothiazide and losartan. No outside blood pressures to report. He reports that 3 weekends ago he got up quickly and ran after his son to play prank on him in about 10-20 seconds after running he got lightheaded and slowly had to sit down on the ground. His lightheadedness past after a few seconds of sitting down and he was never unconscious. He has not had this happen since despite swimming most days of the week and doing strenuous yard work every weekend. Denies known irregular heartbeat nor any other motor or sensory disturbance  He was diagnosed with sensorineural hearing loss 2 years ago in the right ear. He noticed yesterday that his hearing is significantly improved he cut his hand around his right ear and questions this diagnosis and would like to be reevaluated for an application device for his right ear. There's been no decline in his hearing loss since his original diagnosis. He denies any trauma to the right ear.  He'll be leaving for [place] next month and would like to know if he can have malaria prophylaxis.   Review Of Systems Outlined In HPI  Past Medical History  Diagnosis Date  . Hypertension   . Hyperlipidemia   . Prostate cancer     Past Surgical History  Procedure Laterality Date  . Prostatectomy     Family History  Problem  Relation Age of Onset  . Breast cancer Mother     History   Social History  . Marital Status: Married    Spouse Name: N/A  . Number of Children: N/A  . Years of Education: N/A   Occupational History  . Not on file.   Social History Main Topics  . Smoking status: Never Smoker   . Smokeless tobacco: Not on file  . Alcohol Use: Yes  . Drug Use: No  . Sexual Activity: Yes   Other Topics Concern  . Not on file   Social History Narrative     Objective: BP 137/81 mmHg  Pulse 74  Wt 208 lb (94.348 kg)  General: Alert and Oriented, No Acute Distress HEENT: Pupils equal, round, reactive to light. Conjunctivae clear. Randol Kern his membranes pharynx unremarkable  Lungs: Clear to auscultation bilaterally, no wheezing/ronchi/rales.  Comfortable work of breathing. Good air movement. Cardiac: Regular rate and rhythm. Normal S1/S2.  No murmurs, rubs, nor gallops.  No carotid bruit  Abdomen:Obese and soft  Extremities: No peripheral edema.  Strong peripheral pulses.  Mental Status: No depression, anxiety, nor agitation. Skin: Warm and dry.  Assessment & Plan: Blake Holden was seen today for medication refills.  Diagnoses and all orders for this visit:  Hyperlipidemia  Erectile dysfunction following radical prostatectomy Orders: -     tadalafil (CIALIS) 5 MG tablet; Take 1 tablet (5 mg total) by mouth daily.  Essential hypertension Orders: -     losartan-hydrochlorothiazide (HYZAAR) 100-25 MG per tablet; Take 1 tablet by mouth daily.  Syncope and collapse Orders: -     Echocardiogram  Hearing loss, right Orders: -     Ambulatory referral to Audiology  Other orders -     Cancel: losartan (COZAAR) 100 MG tablet; Take 1 tablet (100 mg total) by mouth daily. -     Cancel: hydrochlorothiazide (HYDRODIURIL) 25 MG tablet;  -     simvastatin (ZOCOR) 40 MG tablet; Take 1 tablet (40 mg total) by mouth every evening. -     atovaquone-proguanil (MALARONE) 250-100 MG TABS; Take 1 tablet by  mouth daily. Start two days before exposure and for seven days after return from exposure. -     diltiazem (CARTIA XT) 300 MG 24 hr capsule; Take 1 capsule (300 mg total) by mouth daily.   Hyperlipidemia: Clinically controlled due for lipid panel in August Erectile dysfunction: Controlled on Cialis Essential hypertension controlled with losartan-hydrochlorothiazide and diltiazem Syncope: High suspicion for orthostatic hypertension but discussed it be wise to get an echocardiogram to rule out structural heart disease. Hearing loss: Referral to audiology for second opinion on hearing loss Valeda Malm provided for his upcoming trip to [place] based on CDC recommendations.  40 minutes spent face-to-face during visit today of which at least 50% was counseling or coordinating care regarding: 1. Hyperlipidemia   2. Erectile dysfunction following radical prostatectomy   3. Essential hypertension   4. Syncope and collapse   5. Hearing loss, right       Return in about 6 months (around 03/02/2015).

## 2014-09-07 ENCOUNTER — Other Ambulatory Visit: Payer: Self-pay | Admitting: Family Medicine

## 2014-09-07 ENCOUNTER — Other Ambulatory Visit: Payer: Self-pay | Admitting: *Deleted

## 2014-09-08 ENCOUNTER — Ambulatory Visit (HOSPITAL_BASED_OUTPATIENT_CLINIC_OR_DEPARTMENT_OTHER)
Admission: RE | Admit: 2014-09-08 | Discharge: 2014-09-08 | Disposition: A | Discharge status: Home / Self Care | Payer: BLUE CROSS/BLUE SHIELD | Source: Ambulatory Visit | Attending: Family Medicine | Admitting: Family Medicine

## 2014-09-08 DIAGNOSIS — I10 Essential (primary) hypertension: Secondary | ICD-10-CM | POA: Insufficient documentation

## 2014-09-08 DIAGNOSIS — R55 Syncope and collapse: Secondary | ICD-10-CM | POA: Diagnosis not present

## 2014-09-08 NOTE — Progress Notes (Signed)
Echocardiogram 2D Echocardiogram has been performed.  Tresa Res 09/08/2014, 4:44 PM

## 2014-11-02 ENCOUNTER — Encounter: Payer: Self-pay | Admitting: Family Medicine

## 2014-11-02 ENCOUNTER — Ambulatory Visit (INDEPENDENT_AMBULATORY_CARE_PROVIDER_SITE_OTHER): Payer: BLUE CROSS/BLUE SHIELD | Admitting: Family Medicine

## 2014-11-02 VITALS — BP 117/69 | HR 85 | Temp 98.9°F | Wt 204.0 lb

## 2014-11-02 DIAGNOSIS — J111 Influenza due to unidentified influenza virus with other respiratory manifestations: Secondary | ICD-10-CM

## 2014-11-02 DIAGNOSIS — R69 Illness, unspecified: Principal | ICD-10-CM

## 2014-11-02 MED ORDER — HYDROCODONE-HOMATROPINE 5-1.5 MG/5ML PO SYRP: 5.0000 mL | ORAL_SOLUTION | Freq: Three times a day (TID) | ORAL | Status: DC | PRN

## 2014-11-02 MED ORDER — PREDNISONE 20 MG PO TABS: ORAL_TABLET | ORAL | Status: AC

## 2014-11-02 NOTE — Progress Notes (Signed)
CC: Blake Holden is a 64 y.o. male is here for Sore Throat   Subjective: HPI:  7 days of subjective fever, chills, clamminess, nonproductive cough, diffuse myalgias all of which is moderate in severity. It is present all hours today cough is keeping him awake at night. He tried some medications that are over-the-counter in [place] however they've been not been helpful. Symptoms began on the last day of his trip to [place]. He had a sore throat but this is resolved. His wife had a identical illness but recovered in 5 days of illness. He denies any scleral icterus, jaundice, dysuria, diarrhea, constipation, joint pain, rash, vision loss, headache or abdominal pain. Denies shortness of breath or wheezing   Review Of Systems Outlined In HPI  Past Medical History  Diagnosis Date  . Hypertension   . Hyperlipidemia   . Prostate cancer     Past Surgical History  Procedure Laterality Date  . Prostatectomy     Family History  Problem Relation Age of Onset  . Breast cancer Mother     Social History   Social History  . Marital Status: Married    Spouse Name: N/A  . Number of Children: N/A  . Years of Education: N/A   Occupational History  . Not on file.   Social History Main Topics  . Smoking status: Never Smoker   . Smokeless tobacco: Not on file  . Alcohol Use: Yes  . Drug Use: No  . Sexual Activity: Yes   Other Topics Concern  . Not on file   Social History Narrative     Objective: BP 117/69 mmHg  Pulse 85  Temp(Src) 98.9 F (37.2 C) (Oral)  Wt 204 lb (92.534 kg)  General: Alert and Oriented, No Acute Distress HEENT: Pupils equal, round, reactive to light. Conjunctivae clear.  External ears unremarkable, canals clear with intact TMs with appropriate landmarks.  Middle ear appears open without effusion. Pink inferior turbinates.  Moist mucous membranes, pharynx without inflammation nor lesions.  Neck supple without palpable lymphadenopathy nor abnormal  masses. Lungs: Clear to auscultation bilaterally, no wheezing/ronchi/rales.  Comfortable work of breathing. Good air movement. Occasional coughing Cardiac: Regular rate and rhythm. Normal S1/S2.  No murmurs, rubs, nor gallops.   Abdomen: Mild obesity soft Extremities: No peripheral edema.  Strong peripheral pulses.  Mental Status: No depression, anxiety, nor agitation. Skin: Warm and dry.  Assessment & Plan: Blake Holden was seen today for sore throat.  Diagnoses and all orders for this visit:  Influenza-like illness -     predniSONE (DELTASONE) 20 MG tablet; Three tabs daily days 1-3, two tabs daily days 4-6, one tab daily days 7-9, half tab daily days 10-13. -     HYDROcodone-homatropine (HYCODAN) 5-1.5 MG/5ML syrup; Take 5 mLs by mouth every 8 (eight) hours as needed for cough.   Influenza-like illness: Blake Holden he is not showing any signs of dengue, yellow fever, malaria or bacterial infection. It is quite possible he does have the flu, discussed that antivirals medication would be a waste of money at this time. Hycodan for cough, prednisone to hopefully help with myalgias and cough. Discussed the prednisone is a immunosuppressant therefore if any symptoms worsen or if he has any new symptoms he should stop this immediately and contact me.   Return if symptoms worsen or fail to improve.

## 2014-12-23 ENCOUNTER — Ambulatory Visit (INDEPENDENT_AMBULATORY_CARE_PROVIDER_SITE_OTHER): Payer: BLUE CROSS/BLUE SHIELD | Admitting: Sports Medicine

## 2014-12-23 ENCOUNTER — Ambulatory Visit (INDEPENDENT_AMBULATORY_CARE_PROVIDER_SITE_OTHER): Payer: BLUE CROSS/BLUE SHIELD

## 2014-12-23 VITALS — BP 166/77 | HR 72 | Wt 208.0 lb

## 2014-12-23 DIAGNOSIS — M8580 Other specified disorders of bone density and structure, unspecified site: Secondary | ICD-10-CM

## 2014-12-23 DIAGNOSIS — M5412 Radiculopathy, cervical region: Secondary | ICD-10-CM

## 2014-12-23 MED ORDER — CYCLOBENZAPRINE HCL 10 MG PO TABS: ORAL_TABLET | ORAL | Status: DC

## 2014-12-23 MED ORDER — PREDNISONE 50 MG PO TABS: ORAL_TABLET | ORAL | Status: DC

## 2014-12-23 MED ORDER — MELOXICAM 15 MG PO TABS: ORAL_TABLET | ORAL | Status: DC

## 2014-12-23 NOTE — Assessment & Plan Note (Signed)
We will start conservatively with physical therapy, steroids, NSAIDs, muscle relaxers at bedtime. X-rays will probably show C4-5 and C5-6 degenerative disc disease.  Return in one month, MRI for interventional injection planning if no better.

## 2014-12-23 NOTE — Progress Notes (Signed)
   Subjective:    I'm seeing this patient as a consultation for:  Dr. Marcial Pacas  CC: right shoulder pain  HPI: For years this pleasant 64 year old male has had stiffness in his neck and pain that he describes going into the upper shoulder, occasionally radiating around the scapula medially, but nothing past the deltoid or into the hand, moderate, persistent, symptoms are somewhat better with abduction of the shoulder. No bowel or bladder dysfunction, saddle numbness, lower extremity symptoms, or constitutional symptoms, no recent trauma.  Past medical history, Surgical history, Family history not pertinant except as noted below, Social history, Allergies, and medications have been entered into the medical record, reviewed, and no changes needed.   Review of Systems: No headache, visual changes, nausea, vomiting, diarrhea, constipation, dizziness, abdominal pain, skin rash, fevers, chills, night sweats, weight loss, swollen lymph nodes, body aches, joint swelling, muscle aches, chest pain, shortness of breath, mood changes, visual or auditory hallucinations.   Objective:   General: Well Developed, well nourished, and in no acute distress.  Neuro/Psych: Alert and oriented x3, extra-ocular muscles intact, able to move all 4 extremities, sensation grossly intact. Skin: Warm and dry, no rashes noted.  Respiratory: Not using accessory muscles, speaking in full sentences, trachea midline.  Cardiovascular: Pulses palpable, no extremity edema. Abdomen: Does not appear distended. Neck: Negative spurling's Full neck range of motion Grip strength and sensation normal in bilateral hands Strength good C4 to T1 distribution No sensory change to C4 to T1 Reflexes normal Shoulder: Inspection reveals no abnormalities, atrophy or asymmetry. Palpation is normal with no tenderness over AC joint or bicipital groove. ROM is full in all planes. Rotator cuff strength normal throughout. No signs of  impingement with negative Neer and Hawkin's tests, empty can. Speeds and Yergason's tests normal. No labral pathology noted with negative Obrien's, negative crank, negative clunk, and good stability. Normal scapular function observed. No painful arc and no drop arm sign. No apprehension sign  X-ray show severe thoracic scoliosis with multilevel degenerative disc disease worse at the C6-C7 level with 3 mm of anterolisthesis.  Impression and Recommendations:   This case required medical decision making of moderate complexity.

## 2015-01-03 ENCOUNTER — Ambulatory Visit (INDEPENDENT_AMBULATORY_CARE_PROVIDER_SITE_OTHER): Payer: BLUE CROSS/BLUE SHIELD | Admitting: Rehabilitative and Restorative Service Providers"

## 2015-01-03 ENCOUNTER — Encounter: Payer: Self-pay | Admitting: Rehabilitative and Restorative Service Providers"

## 2015-01-03 DIAGNOSIS — Z7409 Other reduced mobility: Secondary | ICD-10-CM

## 2015-01-03 DIAGNOSIS — R293 Abnormal posture: Secondary | ICD-10-CM

## 2015-01-03 DIAGNOSIS — M25512 Pain in left shoulder: Secondary | ICD-10-CM

## 2015-01-03 NOTE — Patient Instructions (Signed)
Ball release work with ~4 in Sales promotion account executive.  Work on Holiday representative work and home seated surfaces  Shoulder Blade Squeeze   Can use swim noodle or rolled towel along spine. Rotate shoulders back, then squeeze shoulder blades down and back Hold 10 sec Repeat __10__ times. Do __several__ sessions per day.  Scapula Adduction With Pectoralis Stretch: Low - Standing   Shoulders at 45 hands even with shoulders, keeping weight through legs, shift weight forward until you feel pull or stretch through the front of your chest. Hold _30__ seconds. Do _3__ times, _2-4__ times per day.   Scapula Adduction With Pectoralis Stretch: Mid-Range - Standing   Shoulders at 90 elbows even with shoulders, keeping weight through legs, shift weight forward until you feel pull or strength through the front of your chest. Hold __30_ seconds. Do _3__ times, __2-4_ times per day.   Scapula Adduction With Pectoralis Stretch: High - Standing   Shoulders at 120 hands up high on the doorway, keeping weight on feet, shift weight forward until you feel pull or stretch through the front of your chest. Hold _30__ seconds. Do _3__ times, _2-3__ times per day.

## 2015-01-03 NOTE — Therapy (Addendum)
[facility] [place], [place], [address] Phone: [phone]   Fax:  [phone]  Physical Therapy Evaluation  Patient Details  Name: Blake Holden MRN: [id number] Date of Birth: 1950-05-01 Referring Provider: Dr. Belinda Fisher  Encounter Date: 01/03/2015      PT End of Session - 01/03/15 1750    Visit Number 1   Number of Visits 12   Date for PT Re-Evaluation 02/14/15   PT Start Time 1633   PT Stop Time 1719   PT Time Calculation (min) 46 min   Activity Tolerance Patient tolerated treatment well      Past Medical History  Diagnosis Date  . Hypertension   . Hyperlipidemia   . Prostate cancer [facility])     Past Surgical History  Procedure Laterality Date  . Prostatectomy      There were no vitals filed for this visit.  Visit Diagnosis:  Pain in shoulder region, left - Plan: PT plan of care cert/re-cert  Abnormal posture - Plan: PT plan of care cert/re-cert  Decreased mobility and endurance - Plan: PT plan of care cert/re-cert      Subjective Assessment - 01/03/15 1438    Subjective Zaidan reports that he has been having neck pain and Rt UE pain for the past ~6 months with symptoms gradually increasing. He was treated with chiropractic care and massage with no change. He was treated with medication with good improvement.    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 no limit   How long can you stand comfortably? no limit   How long can you walk comfortably? no limit   Diagnostic tests xrays    Patient Stated Goals get rid of pain    Currently in Pain? Yes   Pain Score 1    Pain Location Shoulder   Pain Orientation Right   Pain Descriptors / Indicators Dull   Pain Type Chronic pain   Pain Radiating Towards up into Rt neck area at times   Pain Onset More than a month ago   Pain Frequency Intermittent   Aggravating  Factors  pulling on a tool; physical force; ER in MD office   Pain Relieving Factors advil; meds for MD            [facility] PT Assessment - 01/03/15 0001    Assessment   Medical Diagnosis cervical radiculopathy    Referring Provider Dr. Belinda Fisher   Onset Date/Surgical Date 06/26/14   Hand Dominance Right   Next MD Visit 11/16   Precautions   Precautions None   Balance Screen   Has the patient fallen in the past 6 months No   Has the patient had a decrease in activity level because of a fear of falling?  No   Is the patient reluctant to leave their home because of a fear of falling?  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 yard work/farm work   Observation/Other Assessments   Focus on Therapeutic Outcomes (FOTO)  34% limitation   Sensation   Additional Comments WFL's per pt report   Posture/Postural Control  Posture Comments head forward; shoulders rounded and elevated; head of the humerus anterior in orientation Rt > Lt   AROM   Right Shoulder Flexion 154 Degrees   Right Shoulder ABduction 150 Degrees   Right Shoulder Internal Rotation 30 Degrees   Right Shoulder External Rotation 88 Degrees  painful   Left Shoulder Flexion 158 Degrees   Left Shoulder ABduction 155 Degrees   Left Shoulder Internal Rotation 35 Degrees   Left Shoulder External Rotation 94 Degrees   Cervical Flexion 50   Cervical Extension 421   Cervical - Right Side Bend 28   Cervical - Left Side Bend 34   Cervical - Right Rotation 62   Cervical - Left Rotation 65   Strength   Overall Strength Comments 5/5 bilat UE's   mild pain with resisted Rt ER   Palpation   Palpation comment tight pecs/upper  trap/leveator Rt >> Lt                    [facility] Adult PT Treatment/Exercise - 01/03/15 0001    Therapeutic Activites    Therapeutic Activities --  myofacial ball release work back/shd/pecs   Neuro Re-ed    Neuro Re-ed Details  postural reed working on pulling shoulders down and back working to engage posterior shoulder girdle musculature   Shoulder Exercises: Supine   Other Supine Exercises prolonged snow angel stretch 2-3 min   Shoulder Exercises: Standing   Retraction 10 reps;Both  with noodle 10 sec hold   Other Standing Exercises chest lift   Shoulder Exercises: Stretch   Corner Stretch Limitations doorway 3 positions 30 sec x3                PT Education - 01/03/15 1515    Education provided Yes   Education Details posture and alignment; myofacial ball release work; Production manager) Educated Patient   Methods Explanation;Demonstration;Tactile cues;Verbal cues;Handout   Comprehension Verbalized understanding;Returned demonstration;Verbal cues required;Tactile cues required             PT Long Term Goals - 01/03/15 1755    PT LONG TERM GOAL #1   Title Improve posture and alignment with scapulae abducted and down along the thoracic wall 02/14/15   Time 6   Period Weeks   Status New   PT LONG TERM GOAL #2   Title Increase AROM c-spine lat flexion and rotation and bilat shoulder elevation by 5-8 degrees 02/14/15   Time 6   Period Weeks   Status New   PT LONG TERM GOAL #3   Title Patient to lift Rt UE without pain for functional activities 02/14/15   Time 6   Period Weeks   PT LONG TERM GOAL #4   Title Improve FOTO to </= 25% limitation 02/14/15   Time 6   Period Weeks   Status New               Plan - 01/03/15 1751    Clinical Impression Statement Pt presents with c/o of Rt shoulder and neck stiffness and discomfort which has worsened over the past 6 months. He has poor posture and alignment; limited shoulder and cervical ROM; pain  with resisted Rt shoulder ER; muscular tightness to palpatioin; muscular and postural imbalance; pain on a daily basis. Pt will benefit form PT to address problems identified and improve functional activity level, decreasing pain.     Pt will benefit from skilled therapeutic intervention in order to improve on the following  deficits Postural dysfunction;Improper body mechanics;Pain;Decreased range of motion;Decreased activity tolerance;Decreased endurance;Increased fascial restricitons   Rehab Potential Good   PT Frequency 2x / week   PT Duration 6 weeks   PT Treatment/Interventions Patient/family education;ADLs/Self Care Home Management;Dry needling;Manual techniques;Therapeutic exercise;Therapeutic activities;Neuromuscular re-education;Cryotherapy;Electrical Stimulation;Moist Heat;Ultrasound   PT Next Visit Plan postural correction/education; pec stretching progressing to posterior shoulder girdle strengthening as indicated; manual therapy - inc thoracic mobs; work through Arts development officer; dry needling as indicated   PT Home Exercise Plan postural correction; myofacial ball release; HEP - pt declined modalities at eval - use PRN   Consulted and Agree with Plan of Care Patient         Problem List Patient Active Problem List   Diagnosis Date Noted  . Right cervical radiculopathy 12/23/2014  . Osteopenia determined by x-ray 12/23/2014  . Essential hypertension 12/15/2013  . Cough 05/26/2013  . Hyperlipidemia 01/03/2012  . ED (erectile dysfunction) 01/03/2012  . Elevated blood sugar 01/03/2012  . History of prostate cancer 12/17/2011  . Postnasal drip 12/17/2011    Rosalva Neary Nilda Simmer PT, MPH 01/03/2015, 6:02 PM  [facility] [place], [place], [address] Phone: [phone]   Fax:  [phone]  Name: Blake Holden MRN: [id number] Date of Birth: [birth year]   PHYSICAL THERAPY DISCHARGE SUMMARY  Visits from Start of Care:  1  Current functional level related to goals / functional outcomes: unknown   Remaining deficits: unchanged   Education / Equipment: HEP  Plan: Patient agrees to discharge.  Patient goals were not met. Patient is being discharged due to not returning since the last visit.  ?????    Heiress Williamson P. Helene Kelp PT, MPH 02/11/2015 1:43 PM

## 2015-01-05 ENCOUNTER — Encounter: Payer: BLUE CROSS/BLUE SHIELD | Admitting: Rehabilitative and Restorative Service Providers"

## 2015-01-20 ENCOUNTER — Ambulatory Visit: Payer: BLUE CROSS/BLUE SHIELD | Admitting: Sports Medicine

## 2015-05-24 ENCOUNTER — Other Ambulatory Visit: Payer: Self-pay | Admitting: Family Medicine

## 2015-06-01 ENCOUNTER — Telehealth: Payer: Self-pay | Admitting: Family Medicine

## 2015-06-01 NOTE — Telephone Encounter (Signed)
I called pt and left a voice mail letting him know to call the office and schedule a f/u appt with Dr. Ileene Rubens for med refills

## 2015-06-20 ENCOUNTER — Other Ambulatory Visit: Payer: Self-pay | Admitting: Family Medicine

## 2015-06-20 ENCOUNTER — Encounter: Payer: Self-pay | Admitting: Family Medicine

## 2015-06-20 ENCOUNTER — Ambulatory Visit (INDEPENDENT_AMBULATORY_CARE_PROVIDER_SITE_OTHER): Payer: BLUE CROSS/BLUE SHIELD | Admitting: Family Medicine

## 2015-06-20 VITALS — BP 150/91 | HR 68 | Wt 208.0 lb

## 2015-06-20 DIAGNOSIS — N5231 Erectile dysfunction following radical prostatectomy: Secondary | ICD-10-CM | POA: Diagnosis not present

## 2015-06-20 DIAGNOSIS — I10 Essential (primary) hypertension: Secondary | ICD-10-CM

## 2015-06-20 DIAGNOSIS — E785 Hyperlipidemia, unspecified: Secondary | ICD-10-CM | POA: Diagnosis not present

## 2015-06-20 MED ORDER — LOSARTAN POTASSIUM-HCTZ 100-25 MG PO TABS: 1.0000 | ORAL_TABLET | Freq: Every day | ORAL | Status: DC

## 2015-06-20 MED ORDER — SIMVASTATIN 40 MG PO TABS: 40.0000 mg | ORAL_TABLET | Freq: Every evening | ORAL | Status: DC

## 2015-06-20 MED ORDER — TADALAFIL 5 MG PO TABS: 5.0000 mg | ORAL_TABLET | Freq: Every day | ORAL | Status: DC

## 2015-06-20 NOTE — Progress Notes (Signed)
CC: Blake Holden is a 65 y.o. male is here for Medication Refill   Subjective: HPI:  Follow-up erectile dysfunction: Taking Cialis on a daily basis. He denies any urinary complaints or difficulty initiating an erection.  Follow-up essential hypertension: He doesn't his blood pressures been elevated ever since he started thinking about his son whose trying to find a detox program but at the same time needs to be available for court date  which is omplicating family dynamics. he tells me when he is not actively trying to juggle this is blood pressure is normal. Denies chest pain shortness of breath orthopnea nor peripheral edema.  Requesting refill on simvastatin. Denies right upper quadrant pain or myalgias. No limb claudication   Review Of Systems Outlined In HPI  Past Medical History  Diagnosis Date  . Hypertension   . Hyperlipidemia   . Prostate cancer [facility])     Past Surgical History  Procedure Laterality Date  . Prostatectomy     Family History  Problem Relation Age of Onset  . Breast cancer Mother     Social History   Social History  . Marital Status: Married    Spouse Name: N/A  . Number of Children: N/A  . Years of Education: N/A   Occupational History  . Not on file.   Social History Main Topics  . Smoking status: Never Smoker   . Smokeless tobacco: Not on file  . Alcohol Use: Yes  . Drug Use: No  . Sexual Activity: Yes   Other Topics Concern  . Not on file   Social History Narrative     Objective: BP 150/91 mmHg  Pulse 68  Wt 208 lb (94.348 kg)  General: Alert and Oriented, No Acute Distress HEENT: Pupils equal, round, reactive to light. Conjunctivae clear.  Moist mucous membranes Lungs: Clear to auscultation bilaterally, no wheezing/ronchi/rales.  Comfortable work of breathing. Good air movement. Cardiac: Regular rate and rhythm. Normal S1/S2.  No murmurs, rubs, nor gallops.   Extremities: No peripheral edema.  Strong peripheral pulses.  Mental  Status: No depression, anxiety, nor agitation. Skin: Warm and dry.  Assessment & Plan: Terrol was seen today for medication refill.  Diagnoses and all orders for this visit:  Erectile dysfunction following radical prostatectomy -     tadalafil (CIALIS) 5 MG tablet; Take 1 tablet (5 mg total) by mouth daily.  Essential hypertension -     losartan-hydrochlorothiazide (HYZAAR) 100-25 MG tablet; Take 1 tablet by mouth daily.  Hyperlipidemia  Other orders -     simvastatin (ZOCOR) 40 MG tablet; Take 1 tablet (40 mg total) by mouth every evening.   Erectile dysfunction: Controlled with Cialis Essential hypertension: Controlled with Hyzaar as long as he is not getting stressed out about his son's situation. Hyperlipidemia: Controlled with simvastatin    Return in about 9 months (around 03/21/2016) for BP follow up.

## 2015-06-29 ENCOUNTER — Other Ambulatory Visit: Payer: Self-pay | Admitting: Family Medicine

## 2015-07-30 ENCOUNTER — Other Ambulatory Visit: Payer: Self-pay | Admitting: Family Medicine

## 2015-08-15 ENCOUNTER — Ambulatory Visit (HOSPITAL_BASED_OUTPATIENT_CLINIC_OR_DEPARTMENT_OTHER): Payer: BLUE CROSS/BLUE SHIELD

## 2015-08-15 ENCOUNTER — Other Ambulatory Visit: Payer: Self-pay | Admitting: Sports Medicine

## 2015-08-15 ENCOUNTER — Encounter: Payer: Self-pay | Admitting: Sports Medicine

## 2015-08-15 ENCOUNTER — Ambulatory Visit (INDEPENDENT_AMBULATORY_CARE_PROVIDER_SITE_OTHER): Payer: BLUE CROSS/BLUE SHIELD

## 2015-08-15 ENCOUNTER — Ambulatory Visit (INDEPENDENT_AMBULATORY_CARE_PROVIDER_SITE_OTHER): Payer: BLUE CROSS/BLUE SHIELD | Admitting: Sports Medicine

## 2015-08-15 VITALS — BP 176/91 | HR 61 | Resp 18 | Wt 210.6 lb

## 2015-08-15 DIAGNOSIS — M5416 Radiculopathy, lumbar region: Secondary | ICD-10-CM | POA: Diagnosis not present

## 2015-08-15 DIAGNOSIS — M5136 Other intervertebral disc degeneration, lumbar region: Secondary | ICD-10-CM

## 2015-08-15 DIAGNOSIS — M5137 Other intervertebral disc degeneration, lumbosacral region: Secondary | ICD-10-CM | POA: Diagnosis not present

## 2015-08-15 MED ORDER — MELOXICAM 15 MG PO TABS: ORAL_TABLET | ORAL | Status: DC

## 2015-08-15 NOTE — Progress Notes (Signed)
   Subjective:    I'm seeing this patient as a consultation for:  Dr. Marcial Pacas  CC: Back and radicular pain  HPI: For 2 weeks after using a weed Mare Ferrari this pleasant 65 year old male has had pain that runs down the lateral aspect of his left leg to his great toe. Moderate, persistent, worse with any position. He has seen a chiropractor without much improvement, and has not noted much improvement with gabapentin or Skelaxin. No bowel or bladder dysfunction, saddle numbness, no constitutional symptoms, no trauma.  Past medical history, Surgical history, Family history not pertinant except as noted below, Social history, Allergies, and medications have been entered into the medical record, reviewed, and no changes needed.   Review of Systems: No headache, visual changes, nausea, vomiting, diarrhea, constipation, dizziness, abdominal pain, skin rash, fevers, chills, night sweats, weight loss, swollen lymph nodes, body aches, joint swelling, muscle aches, chest pain, shortness of breath, mood changes, visual or auditory hallucinations.   Objective:   General: Well Developed, well nourished, and in no acute distress.  Neuro/Psych: Alert and oriented x3, extra-ocular muscles intact, able to move all 4 extremities, sensation grossly intact. Skin: Warm and dry, no rashes noted.  Respiratory: Not using accessory muscles, speaking in full sentences, trachea midline.  Cardiovascular: Pulses palpable, no extremity edema. Abdomen: Does not appear distended. Back Exam:  Inspection: Unremarkable  Motion: Flexion 45 deg, Extension 45 deg, Side Bending to 45 deg bilaterally,  Rotation to 45 deg bilaterally  SLR laying: Negative  XSLR laying: Negative  Palpable tenderness: None. FABER: negative. Sensory change: Gross sensation intact to all lumbar and sacral dermatomes.  Reflexes: 2+ at both patellar tendons, 2+ at achilles tendons, Babinski's downgoing.  Strength at foot  Plantar-flexion: 5/5  Dorsi-flexion: 5/5 Eversion: 5/5 Inversion: 5/5  Leg strength  Quad: 5/5 Hamstring: 5/5 Hip flexor: 5/5 Hip abductors: 5/5  Gait unremarkable.  Impression and Recommendations:   This case required medical decision making of moderate complexity.

## 2015-08-15 NOTE — Assessment & Plan Note (Signed)
Left L4, x-rays, meloxicam, gabapentin at bedtime, discontinue Skelaxin, formal physical therapy, return to see me in one month, MRI for interventional injection planning if no better, he will also make an appointment with his PCP to discuss weight loss medication.

## 2015-08-29 ENCOUNTER — Other Ambulatory Visit: Payer: Self-pay | Admitting: Family Medicine

## 2015-09-12 ENCOUNTER — Ambulatory Visit: Payer: BLUE CROSS/BLUE SHIELD | Admitting: Family Medicine

## 2015-09-12 ENCOUNTER — Ambulatory Visit: Payer: BLUE CROSS/BLUE SHIELD | Admitting: Sports Medicine

## 2015-09-27 ENCOUNTER — Other Ambulatory Visit: Payer: Self-pay | Admitting: Family Medicine

## 2015-09-27 ENCOUNTER — Other Ambulatory Visit: Payer: Self-pay | Admitting: Sports Medicine

## 2015-10-18 ENCOUNTER — Other Ambulatory Visit: Payer: Self-pay | Admitting: Family Medicine

## 2015-10-18 ENCOUNTER — Other Ambulatory Visit: Payer: Self-pay

## 2015-10-18 ENCOUNTER — Telehealth: Payer: Self-pay

## 2015-10-18 MED ORDER — DILTIAZEM HCL ER COATED BEADS 300 MG PO CP24: 300.0000 mg | ORAL_CAPSULE | Freq: Every day | ORAL | 0 refills | Status: DC

## 2015-10-18 NOTE — Telephone Encounter (Signed)
Yes, any pharmacy he chooses.

## 2015-10-18 NOTE — Telephone Encounter (Signed)
90 day sent to [place]

## 2015-10-19 ENCOUNTER — Telehealth: Payer: Self-pay | Admitting: *Deleted

## 2015-10-19 MED ORDER — MELOXICAM 15 MG PO TABS: ORAL_TABLET | ORAL | 0 refills | Status: AC

## 2015-10-19 NOTE — Telephone Encounter (Signed)
Pharmacy requests 90 supply for meloxicam.rx sent

## 2016-01-03 DIAGNOSIS — Z23 Encounter for immunization: Secondary | ICD-10-CM | POA: Diagnosis not present

## 2016-01-04 ENCOUNTER — Other Ambulatory Visit: Payer: Self-pay

## 2016-01-04 MED ORDER — DILTIAZEM HCL ER COATED BEADS 300 MG PO CP24: 300.0000 mg | ORAL_CAPSULE | Freq: Every day | ORAL | 0 refills | Status: DC

## 2016-01-13 DIAGNOSIS — H2511 Age-related nuclear cataract, right eye: Secondary | ICD-10-CM | POA: Diagnosis not present

## 2016-01-13 DIAGNOSIS — I10 Essential (primary) hypertension: Secondary | ICD-10-CM | POA: Diagnosis not present

## 2016-01-13 DIAGNOSIS — Z961 Presence of intraocular lens: Secondary | ICD-10-CM | POA: Diagnosis not present

## 2016-01-13 DIAGNOSIS — R51 Headache: Secondary | ICD-10-CM | POA: Diagnosis not present

## 2016-01-23 ENCOUNTER — Other Ambulatory Visit: Payer: Self-pay

## 2016-01-23 MED ORDER — DILTIAZEM HCL ER COATED BEADS 300 MG PO CP24: 300.0000 mg | ORAL_CAPSULE | Freq: Every day | ORAL | 0 refills | Status: DC

## 2016-01-23 NOTE — Progress Notes (Signed)
Rx signed under Provider covering Blake Holden's basket. Pt needs to establish with Provider in office that is accepting new Pt's.

## 2016-01-24 DIAGNOSIS — H01021 Squamous blepharitis right upper eyelid: Secondary | ICD-10-CM | POA: Diagnosis not present

## 2016-01-24 DIAGNOSIS — H5211 Myopia, right eye: Secondary | ICD-10-CM | POA: Diagnosis not present

## 2016-01-24 DIAGNOSIS — H25811 Combined forms of age-related cataract, right eye: Secondary | ICD-10-CM | POA: Diagnosis not present

## 2016-01-24 DIAGNOSIS — H01022 Squamous blepharitis right lower eyelid: Secondary | ICD-10-CM | POA: Diagnosis not present

## 2016-01-24 DIAGNOSIS — H01025 Squamous blepharitis left lower eyelid: Secondary | ICD-10-CM | POA: Diagnosis not present

## 2016-01-24 DIAGNOSIS — H524 Presbyopia: Secondary | ICD-10-CM | POA: Diagnosis not present

## 2016-01-24 DIAGNOSIS — H01024 Squamous blepharitis left upper eyelid: Secondary | ICD-10-CM | POA: Diagnosis not present

## 2016-01-24 DIAGNOSIS — H26492 Other secondary cataract, left eye: Secondary | ICD-10-CM | POA: Diagnosis not present

## 2016-02-05 ENCOUNTER — Other Ambulatory Visit: Payer: Self-pay | Admitting: Family Medicine

## 2016-02-16 DIAGNOSIS — E785 Hyperlipidemia, unspecified: Secondary | ICD-10-CM | POA: Diagnosis not present

## 2016-02-16 DIAGNOSIS — Z880 Allergy status to penicillin: Secondary | ICD-10-CM | POA: Diagnosis not present

## 2016-02-16 DIAGNOSIS — I10 Essential (primary) hypertension: Secondary | ICD-10-CM | POA: Diagnosis not present

## 2016-02-16 DIAGNOSIS — H52223 Regular astigmatism, bilateral: Secondary | ICD-10-CM | POA: Diagnosis not present

## 2016-02-16 DIAGNOSIS — Z961 Presence of intraocular lens: Secondary | ICD-10-CM | POA: Diagnosis not present

## 2016-02-16 DIAGNOSIS — H25811 Combined forms of age-related cataract, right eye: Secondary | ICD-10-CM | POA: Diagnosis not present

## 2016-02-16 DIAGNOSIS — Z79899 Other long term (current) drug therapy: Secondary | ICD-10-CM | POA: Diagnosis not present

## 2016-02-20 ENCOUNTER — Encounter: Payer: Self-pay | Admitting: Osteopathic Medicine

## 2016-02-20 ENCOUNTER — Ambulatory Visit (INDEPENDENT_AMBULATORY_CARE_PROVIDER_SITE_OTHER): Payer: Medicare Other | Admitting: Osteopathic Medicine

## 2016-02-20 VITALS — BP 150/80 | HR 62 | Ht 66.0 in | Wt 195.0 lb

## 2016-02-20 DIAGNOSIS — Z8546 Personal history of malignant neoplasm of prostate: Secondary | ICD-10-CM

## 2016-02-20 DIAGNOSIS — E785 Hyperlipidemia, unspecified: Secondary | ICD-10-CM | POA: Diagnosis not present

## 2016-02-20 DIAGNOSIS — I10 Essential (primary) hypertension: Secondary | ICD-10-CM | POA: Diagnosis not present

## 2016-02-20 DIAGNOSIS — N5231 Erectile dysfunction following radical prostatectomy: Secondary | ICD-10-CM

## 2016-02-20 DIAGNOSIS — M8580 Other specified disorders of bone density and structure, unspecified site: Secondary | ICD-10-CM | POA: Diagnosis not present

## 2016-02-20 DIAGNOSIS — R739 Hyperglycemia, unspecified: Secondary | ICD-10-CM

## 2016-02-20 MED ORDER — LOSARTAN POTASSIUM-HCTZ 100-25 MG PO TABS: 1.0000 | ORAL_TABLET | Freq: Every day | ORAL | 3 refills | Status: DC

## 2016-02-20 MED ORDER — DILTIAZEM HCL ER COATED BEADS 300 MG PO CP24: 300.0000 mg | ORAL_CAPSULE | Freq: Every day | ORAL | 3 refills | Status: DC

## 2016-02-20 MED ORDER — TADALAFIL 5 MG PO TABS: 5.0000 mg | ORAL_TABLET | Freq: Every day | ORAL | 3 refills | Status: AC

## 2016-02-20 MED ORDER — SIMVASTATIN 40 MG PO TABS: 40.0000 mg | ORAL_TABLET | Freq: Every evening | ORAL | 3 refills | Status: DC

## 2016-02-20 NOTE — Patient Instructions (Signed)
Plan to follow-up in another 3 months, plan to recheck the blood pressure at that visit. If it is still elevated, we will need to consider adding an additional medicine. Please let us know if you have any other questions or concerns in the meantime, and happy holidays!

## 2016-02-20 NOTE — Progress Notes (Signed)
HPI: Blake Holden is a 65 y.o. male  who presents to [facility] today, 02/20/16,  for chief complaint of:  Chief Complaint  Patient presents with  . Establish Care    SWITCH FROM [facility]    Hypertension: Poorly controlled today, he states he is taking medications as usual. Normal renal function, no major concerns on lipid profile.  History of elevated blood sugar: Most recent labs are from 2015. Glucose was elevated at 115, A1c in 2013 was 5.6.    Hyponatremia: Needs refills. Last cholesterol in 2015, total cholesterol 182, LDL 115.  History of prostate cancer, status post radical prostatectomy with subsequent ED. PSA in 2015 was less than 0.01.  Weight loss: intentional, he is watching diet.      Past medical, surgical, social and family history reviewed: Patient Active Problem List   Diagnosis Date Noted  . Left lumbar radiculopathy 08/15/2015  . Right cervical radiculopathy 12/23/2014  . Osteopenia determined by x-ray 12/23/2014  . Essential hypertension 12/15/2013  . Cough 05/26/2013  . Hyperlipidemia 01/03/2012  . ED (erectile dysfunction) 01/03/2012  . Elevated blood sugar 01/03/2012  . History of prostate cancer 12/17/2011  . Postnasal drip 12/17/2011   Past Surgical History:  Procedure Laterality Date  . PROSTATECTOMY     Social History  Substance Use Topics  . Smoking status: Never Smoker  . Smokeless tobacco: Never Used  . Alcohol use Yes   Family History  Problem Relation Age of Onset  . Breast cancer Mother      Current medication list and allergy/intolerance information reviewed:   Current Outpatient Prescriptions  Medication Sig Dispense Refill  . diltiazem (CARDIZEM CD) 300 MG 24 hr capsule Take 1 capsule (300 mg total) by mouth daily. NEED FOLLOW UP APPOINTMENT FOR MORE REFILLS 15 capsule 0  . diltiazem (CARDIZEM CD) 300 MG 24 hr capsule TAKE 1 CAPSULE (300 MG TOTAL) BY MOUTH DAILY. NEED FOLLOW UP APPOINTMENT  FOR MORE REFILLS 7 capsule 0  . losartan-hydrochlorothiazide (HYZAAR) 100-25 MG tablet Take 1 tablet by mouth daily. 90 tablet 3  . meloxicam (MOBIC) 15 MG tablet TAKE 1 TABLET BY MOUTH EVERY MORNING WITH BREAKFAST FOR 2 WEEKS, THENDAILY AS NEEDED FOR PAIN. 90 tablet 0  . simvastatin (ZOCOR) 40 MG tablet Take 1 tablet (40 mg total) by mouth every evening. 90 tablet 2  . tadalafil (CIALIS) 5 MG tablet Take 1 tablet (5 mg total) by mouth daily. 90 tablet 2   No current facility-administered medications for this visit.    Allergies  Allergen Reactions  . Penicillins     REACTION: Swelling      Review of Systems:  Constitutional:  No  fever, no chills, No recent illness, No unintentional weight changes.  HEENT: No  headache, no vision change  Cardiac: No  chest pain, No  pressure, No palpitations  Respiratory:  No  shortness of breath. No  Cough  Gastrointestinal: No  abdominal pain, No  nausea,  Musculoskeletal: No new myalgia/arthralgia   Exam:  BP (!) 150/80   Pulse 62   Ht 5\' 6"  (1.676 m)   Wt 195 lb (88.5 kg)   BMI 31.47 kg/m   Constitutional: VS see above. General Appearance: alert, well-developed, well-nourished, NAD  Eyes: Normal lids and conjunctive, non-icteric sclera  Ears, Nose, Mouth, Throat: MMM, Normal external inspection ears/nares/mouth/lips/gums.   Neck: No masses, trachea midline.   Respiratory: Normal respiratory effort. no wheeze, no rhonchi, no rales  Cardiovascular: S1/S2 normal,  no murmur, no rub/gallop auscultated. RRR. No lower extremity edema.   Musculoskeletal: Gait normal.  Neurological: Normal balance/coordination. No tremor.  Psychiatric: Normal judgment/insight. Normal mood and affect. Oriented x3.     ASSESSMENT/PLAN:   Essential hypertension - Plan: CBC with Differential/Platelet, COMPLETE METABOLIC PANEL WITH GFR, Lipid panel, losartan-hydrochlorothiazide (HYZAAR) 100-25 MG tablet, diltiazem (CARDIZEM CD) 300 MG 24 hr  capsule  Erectile dysfunction following radical prostatectomy - Plan: PSA, tadalafil (CIALIS) 5 MG tablet  Osteopenia determined by x-ray  Hyperlipidemia, unspecified hyperlipidemia type - Plan: simvastatin (ZOCOR) 40 MG tablet  Elevated blood sugar  History of prostate cancer - Plan: PSA    Patient Instructions  Plan to follow-up in another 3 months, plan to recheck the blood pressure at that visit. If it is still elevated, we will need to consider adding an additional medicine. Please let us know if you have any other questions or concerns in the meantime, and happy holidays!    Visit summary with medication list and pertinent instructions was printed for patient to review. All questions at time of visit were answered - patient instructed to contact office with any additional concerns. ER/RTC precautions were reviewed with the patient. Follow-up plan: Return in about 3 months (around 05/20/2016) for [place] .

## 2016-03-07 DIAGNOSIS — I10 Essential (primary) hypertension: Secondary | ICD-10-CM | POA: Diagnosis not present

## 2016-03-07 DIAGNOSIS — H25811 Combined forms of age-related cataract, right eye: Secondary | ICD-10-CM | POA: Diagnosis not present

## 2016-03-07 DIAGNOSIS — Z8546 Personal history of malignant neoplasm of prostate: Secondary | ICD-10-CM | POA: Diagnosis not present

## 2016-03-07 DIAGNOSIS — Z85828 Personal history of other malignant neoplasm of skin: Secondary | ICD-10-CM | POA: Diagnosis not present

## 2016-03-07 DIAGNOSIS — H269 Unspecified cataract: Secondary | ICD-10-CM | POA: Diagnosis not present

## 2016-03-08 DIAGNOSIS — H01025 Squamous blepharitis left lower eyelid: Secondary | ICD-10-CM | POA: Diagnosis not present

## 2016-03-08 DIAGNOSIS — H01024 Squamous blepharitis left upper eyelid: Secondary | ICD-10-CM | POA: Diagnosis not present

## 2016-03-08 DIAGNOSIS — Z880 Allergy status to penicillin: Secondary | ICD-10-CM | POA: Diagnosis not present

## 2016-03-08 DIAGNOSIS — H01021 Squamous blepharitis right upper eyelid: Secondary | ICD-10-CM | POA: Diagnosis not present

## 2016-03-08 DIAGNOSIS — I10 Essential (primary) hypertension: Secondary | ICD-10-CM | POA: Diagnosis not present

## 2016-03-08 DIAGNOSIS — H02834 Dermatochalasis of left upper eyelid: Secondary | ICD-10-CM | POA: Diagnosis not present

## 2016-03-08 DIAGNOSIS — H01022 Squamous blepharitis right lower eyelid: Secondary | ICD-10-CM | POA: Diagnosis not present

## 2016-03-08 DIAGNOSIS — E785 Hyperlipidemia, unspecified: Secondary | ICD-10-CM | POA: Diagnosis not present

## 2016-03-08 DIAGNOSIS — Z9861 Coronary angioplasty status: Secondary | ICD-10-CM | POA: Diagnosis not present

## 2016-03-08 DIAGNOSIS — H26492 Other secondary cataract, left eye: Secondary | ICD-10-CM | POA: Diagnosis not present

## 2016-03-08 DIAGNOSIS — H5211 Myopia, right eye: Secondary | ICD-10-CM | POA: Diagnosis not present

## 2016-03-08 DIAGNOSIS — H524 Presbyopia: Secondary | ICD-10-CM | POA: Diagnosis not present

## 2016-03-08 DIAGNOSIS — Z9841 Cataract extraction status, right eye: Secondary | ICD-10-CM | POA: Diagnosis not present

## 2016-03-08 DIAGNOSIS — H02831 Dermatochalasis of right upper eyelid: Secondary | ICD-10-CM | POA: Diagnosis not present

## 2016-03-23 DIAGNOSIS — H01025 Squamous blepharitis left lower eyelid: Secondary | ICD-10-CM | POA: Diagnosis not present

## 2016-03-23 DIAGNOSIS — H5211 Myopia, right eye: Secondary | ICD-10-CM | POA: Diagnosis not present

## 2016-03-23 DIAGNOSIS — H02834 Dermatochalasis of left upper eyelid: Secondary | ICD-10-CM | POA: Diagnosis not present

## 2016-03-23 DIAGNOSIS — H01022 Squamous blepharitis right lower eyelid: Secondary | ICD-10-CM | POA: Diagnosis not present

## 2016-03-23 DIAGNOSIS — H02831 Dermatochalasis of right upper eyelid: Secondary | ICD-10-CM | POA: Diagnosis not present

## 2016-03-23 DIAGNOSIS — Z961 Presence of intraocular lens: Secondary | ICD-10-CM | POA: Diagnosis not present

## 2016-03-23 DIAGNOSIS — Z9889 Other specified postprocedural states: Secondary | ICD-10-CM | POA: Diagnosis not present

## 2016-03-23 DIAGNOSIS — H01021 Squamous blepharitis right upper eyelid: Secondary | ICD-10-CM | POA: Diagnosis not present

## 2016-03-23 DIAGNOSIS — H01024 Squamous blepharitis left upper eyelid: Secondary | ICD-10-CM | POA: Diagnosis not present

## 2016-03-23 DIAGNOSIS — H524 Presbyopia: Secondary | ICD-10-CM | POA: Diagnosis not present

## 2016-03-23 DIAGNOSIS — Z79899 Other long term (current) drug therapy: Secondary | ICD-10-CM | POA: Diagnosis not present

## 2016-04-24 ENCOUNTER — Telehealth: Payer: Self-pay

## 2016-04-24 ENCOUNTER — Telehealth: Payer: Self-pay | Admitting: Emergency Medicine

## 2016-04-24 MED ORDER — ATORVASTATIN CALCIUM 20 MG PO TABS: 20.0000 mg | ORAL_TABLET | Freq: Every day | ORAL | 3 refills | Status: DC

## 2016-04-24 NOTE — Telephone Encounter (Signed)
Sent. Dc'ed simvastatin

## 2016-04-24 NOTE — Telephone Encounter (Signed)
Notified by patient/pharmacy:  Insurance will not cover his rx for simvastatin 40mg ; there is also a cautionery message of c

## 2016-04-24 NOTE — Telephone Encounter (Signed)
Pharmacy called and states there is a caution between Simvastatin 40 mg and diltiazem. He recommends a switch to Lipitor 20 mg.

## 2016-04-25 NOTE — Telephone Encounter (Signed)
Confirmed simvastatin was DC'ed.

## 2016-05-21 ENCOUNTER — Ambulatory Visit: Payer: 59

## 2016-07-09 ENCOUNTER — Other Ambulatory Visit: Payer: Self-pay

## 2016-07-09 DIAGNOSIS — I10 Essential (primary) hypertension: Secondary | ICD-10-CM

## 2016-07-09 MED ORDER — LOSARTAN POTASSIUM-HCTZ 100-25 MG PO TABS: 1.0000 | ORAL_TABLET | Freq: Every day | ORAL | 0 refills | Status: DC

## 2016-08-08 ENCOUNTER — Telehealth: Payer: Self-pay | Admitting: Osteopathic Medicine

## 2016-08-08 NOTE — Telephone Encounter (Signed)
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 He was unsure if he would need to make an appointment with you or if you could just fill out the paperwork. The paperwork is in your box, just in case. Please let me know what you would like him to do, and I will give him a call. Thanks!

## 2016-08-08 NOTE — Telephone Encounter (Signed)
He is overdue to recheck blood pressure - was a bit high at his last viist, I'd like him to schedule an appointment to recheck blood pressure and fill out paperwork

## 2016-08-14 ENCOUNTER — Encounter: Payer: Self-pay | Admitting: Osteopathic Medicine

## 2016-08-14 ENCOUNTER — Ambulatory Visit (INDEPENDENT_AMBULATORY_CARE_PROVIDER_SITE_OTHER): Payer: Medicare Other | Admitting: Osteopathic Medicine

## 2016-08-14 VITALS — BP 150/95 | HR 66 | Ht 66.0 in | Wt 199.0 lb

## 2016-08-14 DIAGNOSIS — I10 Essential (primary) hypertension: Secondary | ICD-10-CM

## 2016-08-14 LAB — CBC WITH DIFFERENTIAL/PLATELET
Basophils Absolute: 84 cells/uL (ref 0–200)
Basophils Relative: 1 %
EOS PCT: 6 %
Eosinophils Absolute: 504 cells/uL — ABNORMAL HIGH (ref 15–500)
HCT: 49.1 % (ref 38.5–50.0)
HEMOGLOBIN: 16.8 g/dL (ref 13.2–17.1)
LYMPHS ABS: 2100 {cells}/uL (ref 850–3900)
Lymphocytes Relative: 25 %
MCH: 31.5 pg (ref 27.0–33.0)
MCHC: 34.2 g/dL (ref 32.0–36.0)
MCV: 91.9 fL (ref 80.0–100.0)
MPV: 9.3 fL (ref 7.5–12.5)
Monocytes Absolute: 504 cells/uL (ref 200–950)
Monocytes Relative: 6 %
NEUTROS ABS: 5208 {cells}/uL (ref 1500–7800)
Neutrophils Relative %: 62 %
Platelets: 247 10*3/uL (ref 140–400)
RBC: 5.34 MIL/uL (ref 4.20–5.80)
RDW: 13.6 % (ref 11.0–15.0)
WBC: 8.4 10*3/uL (ref 3.8–10.8)

## 2016-08-14 MED ORDER — METOPROLOL SUCCINATE ER 50 MG PO TB24: 50.0000 mg | ORAL_TABLET | Freq: Every day | ORAL | 1 refills | Status: DC

## 2016-08-14 NOTE — Progress Notes (Signed)
HPI: Blake Holden is a 66 y.o. male  who presents to [facility] today, 08/14/16,  for chief complaint of:  Chief Complaint  Patient presents with  . 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 BP not at goal. Needs updated labs. No known DM though Hx elevated blood sugar. He is fasting today and ready to get labs done.  No respiratory disease or history of chest trauma, no history of cardiac abnormality w/ exception of HTN. No CP/SOB.   Past medical history, surgical history, social history and family history reviewed.  Patient Active Problem List   Diagnosis Date Noted  . Left lumbar radiculopathy 08/15/2015  . Right cervical radiculopathy 12/23/2014  . Osteopenia determined by x-ray 12/23/2014  . Essential hypertension 12/15/2013  . Cough 05/26/2013  . Hyperlipidemia 01/03/2012  . ED (erectile dysfunction) 01/03/2012  . Elevated blood sugar 01/03/2012  . History of prostate cancer 12/17/2011  . Postnasal drip 12/17/2011    Current medication list and allergy/intolerance information reviewed.   Current Outpatient Prescriptions on File Prior to Visit  Medication Sig Dispense Refill  . atorvastatin (LIPITOR) 20 MG tablet Take 1 tablet (20 mg total) by mouth daily. 90 tablet 3  . diltiazem (CARDIZEM CD) 300 MG 24 hr capsule Take 1 capsule (300 mg total) by mouth daily. 90 capsule 3  . losartan-hydrochlorothiazide (HYZAAR) 100-25 MG tablet Take 1 tablet by mouth daily. Due for follow up appointment. 90 tablet 0  . meloxicam (MOBIC) 15 MG tablet TAKE 1 TABLET BY MOUTH EVERY MORNING WITH BREAKFAST FOR 2 WEEKS, THENDAILY AS NEEDED FOR PAIN. 90 tablet 0  . tadalafil (CIALIS) 5 MG tablet Take 1 tablet (5 mg total) by mouth daily. 90 tablet 3   No current facility-administered medications on file prior to visit.    Allergies  Allergen Reactions  . Penicillins     REACTION: Swelling       Review of Systems:  Constitutional: No recent illness  HEENT: No  headache, no vision change  Cardiac: No  chest pain, No  pressure, No palpitations  Respiratory:  No  shortness of breath. No  Cough  Gastrointestinal: No  abdominal pain  Musculoskeletal: No new myalgia/arthralgia  Neurologic: No  weakness, No  Dizziness   Exam:  BP (!) 150/95   Pulse 66   Ht 5\' 6"  (1.676 m)   Wt 199 lb (90.3 kg)   BMI 32.12 kg/m   Constitutional: VS see above. General Appearance: alert, well-developed, well-nourished, NAD  Eyes: Normal lids and conjunctive, non-icteric sclera  Ears, Nose, Mouth, Throat: MMM, Normal external inspection ears/nares/mouth/lips/gums.  Neck: No masses, trachea midline.   Respiratory: Normal respiratory effort. no wheeze, no rhonchi, no rales  Cardiovascular: S1/S2 normal, no murmur, no rub/gallop auscultated. RRR.   Musculoskeletal: Gait normal. Symmetric and independent movement of all extremities  Neurological: Normal balance/coordination. No tremor.  Skin: warm, dry, intact.   Psychiatric: Normal judgment/insight. Normal mood and affect. Oriented x3.     ASSESSMENT/PLAN:   No contraindications to diving, though caution given HTN and pending labs - this was conveyed in the paperwork   Discussed risks of uncontrolled blood pressure, patient is maxed out on several other medications, we'll go ahead and add beta blocker  Labs reprinted, patient sent downstairs for blood draw  Hypertension, unspecified type - Plan: metoprolol succinate (TOPROL-XL) 50 MG 24 hr tablet    Follow-up plan:  Return in about 1 week (around 08/21/2016) for recheck BP and go over labs.  Visit summary with medication list and pertinent instructions was printed for patient to review, alert [place] if any changes needed. All questions at time of visit were answered - patient instructed to contact office with any additional concerns. ER/RTC precautions were reviewed with the  patient and understanding verbalized.

## 2016-08-15 ENCOUNTER — Other Ambulatory Visit: Payer: Self-pay | Admitting: Osteopathic Medicine

## 2016-08-15 ENCOUNTER — Telehealth: Payer: Self-pay | Admitting: Osteopathic Medicine

## 2016-08-15 DIAGNOSIS — R7301 Impaired fasting glucose: Secondary | ICD-10-CM

## 2016-08-15 DIAGNOSIS — Z8546 Personal history of malignant neoplasm of prostate: Secondary | ICD-10-CM

## 2016-08-15 LAB — COMPLETE METABOLIC PANEL WITH GFR
ALBUMIN: 4.1 g/dL (ref 3.6–5.1)
ALK PHOS: 66 U/L (ref 40–115)
ALT: 24 U/L (ref 9–46)
AST: 17 U/L (ref 10–35)
BUN: 23 mg/dL (ref 7–25)
CHLORIDE: 105 mmol/L (ref 98–110)
CO2: 27 mmol/L (ref 20–31)
Calcium: 9.7 mg/dL (ref 8.6–10.3)
Creat: 1.24 mg/dL (ref 0.70–1.25)
GFR, EST NON AFRICAN AMERICAN: 61 mL/min (ref >=60)
GFR, Est African American: 70 mL/min (ref >=60)
GLUCOSE: 111 mg/dL — AB (ref 65–99)
POTASSIUM: 4 mmol/L (ref 3.5–5.3)
SODIUM: 142 mmol/L (ref 135–146)
Total Bilirubin: 0.5 mg/dL (ref 0.2–1.2)
Total Protein: 6.8 g/dL (ref 6.1–8.1)

## 2016-08-15 LAB — LIPID PANEL
CHOL/HDL RATIO: 3.4 ratio (ref <5.0)
Cholesterol: 150 mg/dL (ref <200)
HDL: 44 mg/dL (ref >40)
LDL Cholesterol: 90 mg/dL (ref <100)
Triglycerides: 82 mg/dL (ref <150)
VLDL: 16 mg/dL (ref <30)

## 2016-08-15 NOTE — Telephone Encounter (Signed)
Add-on labs

## 2016-08-16 LAB — PSA, TOTAL WITH REFLEX TO PSA, FREE: PSA, Total: <0.1 ng/mL (ref <=4.0)

## 2016-08-16 LAB — HEMOGLOBIN A1C
HEMOGLOBIN A1C: 5.5 % (ref <5.7)
Mean Plasma Glucose: 111 mg/dL

## 2016-08-21 ENCOUNTER — Encounter: Payer: Self-pay | Admitting: Osteopathic Medicine

## 2016-08-21 ENCOUNTER — Ambulatory Visit (INDEPENDENT_AMBULATORY_CARE_PROVIDER_SITE_OTHER): Payer: Medicare Other | Admitting: Osteopathic Medicine

## 2016-08-21 DIAGNOSIS — I10 Essential (primary) hypertension: Secondary | ICD-10-CM

## 2016-08-21 MED ORDER — METOPROLOL SUCCINATE ER 50 MG PO TB24: 50.0000 mg | ORAL_TABLET | Freq: Two times a day (BID) | ORAL | 1 refills | Status: DC

## 2016-08-21 NOTE — Patient Instructions (Signed)
Metoprolol: take twice per day Losartan-Hydrochlorothiazide: AM  Diltiazem: PM   Goal for BP: 140/90 or less = happy, 130/80 or less = super happy!

## 2016-08-21 NOTE — Progress Notes (Signed)
HPI: Blake Holden is a 66 y.o. male  who presents to [facility] today, 08/21/16,  for chief complaint of:  Chief Complaint  Patient presents with  . Follow-up    BLOOD PRESSURE    Recently seen for clearance for skin dieting. Blood pressure at that point not at goal, medications adjusted - patient already on maximum dose of diltiazem, losartan, hydrochlorothiazide, so we added metoprolol at last visit. Took meds this morning little while ago as usual but BP elevated at this point.     Past medical, surgical, social and family history reviewed: Patient Active Problem List   Diagnosis Date Noted  . Left lumbar radiculopathy 08/15/2015  . Right cervical radiculopathy 12/23/2014  . Osteopenia determined by x-ray 12/23/2014  . Essential hypertension 12/15/2013  . Cough 05/26/2013  . Hyperlipidemia 01/03/2012  . ED (erectile dysfunction) 01/03/2012  . Elevated blood sugar 01/03/2012  . History of prostate cancer 12/17/2011  . Postnasal drip 12/17/2011   Past Surgical History:  Procedure Laterality Date  . PROSTATECTOMY     Social History  Substance Use Topics  . Smoking status: Never Smoker  . Smokeless tobacco: Never Used  . Alcohol use Yes   Family History  Problem Relation Age of Onset  . Breast cancer Mother      Current medication list and allergy/intolerance information reviewed:   Current Outpatient Prescriptions  Medication Sig Dispense Refill  . atorvastatin (LIPITOR) 20 MG tablet Take 1 tablet (20 mg total) by mouth daily. 90 tablet 3  . diltiazem (CARDIZEM CD) 300 MG 24 hr capsule Take 1 capsule (300 mg total) by mouth daily. 90 capsule 3  . losartan-hydrochlorothiazide (HYZAAR) 100-25 MG tablet Take 1 tablet by mouth daily. Due for follow up appointment. 90 tablet 0  . meloxicam (MOBIC) 15 MG tablet TAKE 1 TABLET BY MOUTH EVERY MORNING WITH BREAKFAST FOR 2 WEEKS, THENDAILY AS NEEDED FOR PAIN. 90 tablet 0  . metoprolol  succinate (TOPROL-XL) 50 MG 24 hr tablet Take 1 tablet (50 mg total) by mouth daily. Take with or immediately following a meal. 30 tablet 1  . tadalafil (CIALIS) 5 MG tablet Take 1 tablet (5 mg total) by mouth daily. 90 tablet 3   No current facility-administered medications for this visit.    Allergies  Allergen Reactions  . Penicillins     REACTION: Swelling      Review of Systems:  Constitutional:  No recent illness  HEENT: No  headache, no vision change  Cardiac: No  chest pain, No  pressure, No palpitations, No  Orthopnea  Respiratory:  No  shortness of breath.  Neurologic: No  weakness, No  dizziness   Exam:  BP (!) 175/95   Pulse 62   Wt 200 lb (90.7 kg)   BMI 32.28 kg/m   Constitutional: VS see above. General Appearance: alert, well-developed, well-nourished, NAD  Eyes: Normal lids and conjunctive, non-icteric sclera  Ears, Nose, Mouth, Throat: MMM, Normal external inspection ears/nares/mouth/lips/gums.   Neck: No masses, trachea midline.   Respiratory: Normal respiratory effort. no wheeze, no rhonchi, no rales  Cardiovascular: S1/S2 normal, no murmur, no rub/gallop auscultated. RRR. No lower extremity edema.   Musculoskeletal: Gait normal. No clubbing/cyanosis of digits.   Neurological: Normal balance/coordination. No tremor.   Skin: warm, dry, intact.  Psychiatric: Normal judgment/insight. Normal mood and affect. Oriented x3.   Recent labs reviewed: Elevated fasting blood glucose but A1c was not in diabetic or prediabetic range. Hepatic and renal function  within normal range. Cholesterol improved from last year, LDL 90.    ASSESSMENT/PLAN:   Increase metoprolol - taking meds in AM and PM might be good to spread coverage of active meds throughout the day, will trial splitting doses and f/u for nurse visit.   Hypertension, unspecified type - Plan: metoprolol succinate (TOPROL-XL) 50 MG 24 hr tablet    Patient Instructions  Metoprolol: take twice  per day Losartan-Hydrochlorothiazide: AM  Diltiazem: PM   Goal for BP: 140/90 or less = happy, 130/80 or less = super happy!     Visit summary with medication list and pertinent instructions was printed for patient to review. All questions at time of visit were answered - patient instructed to contact office with any additional concerns. ER/RTC precautions were reviewed with the patient. Follow-up plan: Return for nurse visit BP check next few weeks .  Note: Total time spent 15 minutes, greater than 50% of the visit was spent face-to-face counseling and coordinating care for the following: The encounter diagnosis was Hypertension, unspecified type.Marland Kitchen

## 2016-09-03 ENCOUNTER — Other Ambulatory Visit: Payer: Self-pay

## 2016-09-03 ENCOUNTER — Telehealth: Payer: Self-pay | Admitting: Osteopathic Medicine

## 2016-09-03 DIAGNOSIS — I10 Essential (primary) hypertension: Secondary | ICD-10-CM

## 2016-09-03 MED ORDER — METOPROLOL SUCCINATE ER 50 MG PO TB24: 50.0000 mg | ORAL_TABLET | Freq: Two times a day (BID) | ORAL | 1 refills | Status: DC

## 2016-09-03 NOTE — Progress Notes (Signed)
Rx resent.

## 2016-09-03 NOTE — Telephone Encounter (Signed)
Patient called request to have metoprolol med refilled [facility] stated they do not have any refills on file. They only show last filled 08/14/16 to take 1 tablet daily and the instructions changed per 08/21/16 to take 1 tablet by mouth 2 (two) times daily. Pt request to know if med can be resent to [facility]. Thanks

## 2016-09-04 NOTE — Telephone Encounter (Signed)
Rx has been sent  

## 2016-09-14 ENCOUNTER — Ambulatory Visit (INDEPENDENT_AMBULATORY_CARE_PROVIDER_SITE_OTHER): Payer: Medicare Other | Admitting: Osteopathic Medicine

## 2016-09-14 VITALS — BP 125/72 | HR 50

## 2016-09-14 DIAGNOSIS — I10 Essential (primary) hypertension: Secondary | ICD-10-CM

## 2016-09-14 NOTE — Progress Notes (Signed)
Excellent! OK to follow up with me to recheck BP in 6 months.    Outpatient Encounter Prescriptions as of 09/14/2016  Medication Sig  . atorvastatin (LIPITOR) 20 MG tablet Take 1 tablet (20 mg total) by mouth daily.  Marland Kitchen diltiazem (CARDIZEM CD) 300 MG 24 hr capsule Take 1 capsule (300 mg total) by mouth daily.  Marland Kitchen losartan-hydrochlorothiazide (HYZAAR) 100-25 MG tablet Take 1 tablet by mouth daily. Due for follow up appointment.  . meloxicam (MOBIC) 15 MG tablet TAKE 1 TABLET BY MOUTH EVERY MORNING WITH BREAKFAST FOR 2 WEEKS, THENDAILY AS NEEDED FOR PAIN.  . metoprolol succinate (TOPROL-XL) 50 MG 24 hr tablet Take 1 tablet (50 mg total) by mouth 2 (two) times daily. Take with or immediately following a meal.  . tadalafil (CIALIS) 5 MG tablet Take 1 tablet (5 mg total) by mouth daily.   No facility-administered encounter medications on file as of 09/14/2016.

## 2016-09-14 NOTE — Progress Notes (Signed)
Pt in today for bp check. Bp is awesome today at 128/72!

## 2016-10-04 ENCOUNTER — Other Ambulatory Visit: Payer: Self-pay | Admitting: Osteopathic Medicine

## 2016-10-04 DIAGNOSIS — I10 Essential (primary) hypertension: Secondary | ICD-10-CM

## 2016-10-08 ENCOUNTER — Other Ambulatory Visit: Payer: Self-pay

## 2016-10-08 DIAGNOSIS — I10 Essential (primary) hypertension: Secondary | ICD-10-CM

## 2016-10-08 MED ORDER — METOPROLOL SUCCINATE ER 50 MG PO TB24: 50.0000 mg | ORAL_TABLET | Freq: Two times a day (BID) | ORAL | 0 refills | Status: DC

## 2016-12-26 ENCOUNTER — Other Ambulatory Visit: Payer: Self-pay | Admitting: Osteopathic Medicine

## 2016-12-26 DIAGNOSIS — I10 Essential (primary) hypertension: Secondary | ICD-10-CM

## 2016-12-30 ENCOUNTER — Other Ambulatory Visit: Payer: Self-pay | Admitting: Osteopathic Medicine

## 2016-12-30 DIAGNOSIS — I10 Essential (primary) hypertension: Secondary | ICD-10-CM

## 2017-01-24 DIAGNOSIS — H9041 Sensorineural hearing loss, unilateral, right ear, with unrestricted hearing on the contralateral side: Secondary | ICD-10-CM | POA: Diagnosis not present

## 2017-01-29 DIAGNOSIS — H9041 Sensorineural hearing loss, unilateral, right ear, with unrestricted hearing on the contralateral side: Secondary | ICD-10-CM | POA: Diagnosis not present

## 2017-01-29 DIAGNOSIS — Z880 Allergy status to penicillin: Secondary | ICD-10-CM | POA: Diagnosis not present

## 2017-01-29 DIAGNOSIS — I10 Essential (primary) hypertension: Secondary | ICD-10-CM | POA: Diagnosis not present

## 2017-01-29 DIAGNOSIS — Z961 Presence of intraocular lens: Secondary | ICD-10-CM | POA: Diagnosis not present

## 2017-01-29 DIAGNOSIS — Z9841 Cataract extraction status, right eye: Secondary | ICD-10-CM | POA: Diagnosis not present

## 2017-01-29 DIAGNOSIS — E785 Hyperlipidemia, unspecified: Secondary | ICD-10-CM | POA: Diagnosis not present

## 2017-01-29 DIAGNOSIS — H9042 Sensorineural hearing loss, unilateral, left ear, with unrestricted hearing on the contralateral side: Secondary | ICD-10-CM | POA: Diagnosis not present

## 2017-01-29 DIAGNOSIS — Z79899 Other long term (current) drug therapy: Secondary | ICD-10-CM | POA: Diagnosis not present

## 2017-03-27 ENCOUNTER — Other Ambulatory Visit: Payer: Self-pay | Admitting: Osteopathic Medicine

## 2017-03-27 DIAGNOSIS — I10 Essential (primary) hypertension: Secondary | ICD-10-CM

## 2017-03-28 ENCOUNTER — Other Ambulatory Visit: Payer: Self-pay | Admitting: Osteopathic Medicine

## 2017-03-28 DIAGNOSIS — I10 Essential (primary) hypertension: Secondary | ICD-10-CM

## 2017-04-23 ENCOUNTER — Other Ambulatory Visit: Payer: Self-pay

## 2017-04-23 DIAGNOSIS — I10 Essential (primary) hypertension: Secondary | ICD-10-CM

## 2017-04-23 MED ORDER — ATORVASTATIN CALCIUM 20 MG PO TABS: 20.0000 mg | ORAL_TABLET | Freq: Every day | ORAL | 0 refills | Status: DC

## 2017-04-23 MED ORDER — LOSARTAN POTASSIUM-HCTZ 100-25 MG PO TABS: 1.0000 | ORAL_TABLET | Freq: Every day | ORAL | 0 refills | Status: AC

## 2017-04-23 MED ORDER — DILTIAZEM HCL ER COATED BEADS 300 MG PO CP24: 300.0000 mg | ORAL_CAPSULE | Freq: Every day | ORAL | 0 refills | Status: DC

## 2017-04-23 MED ORDER — METOPROLOL SUCCINATE ER 50 MG PO TB24: 50.0000 mg | ORAL_TABLET | Freq: Two times a day (BID) | ORAL | 0 refills | Status: AC

## 2017-04-25 ENCOUNTER — Other Ambulatory Visit: Payer: Self-pay | Admitting: Osteopathic Medicine

## 2017-04-25 DIAGNOSIS — I10 Essential (primary) hypertension: Secondary | ICD-10-CM

## 2017-05-01 ENCOUNTER — Other Ambulatory Visit: Payer: Self-pay

## 2017-05-01 DIAGNOSIS — I10 Essential (primary) hypertension: Secondary | ICD-10-CM

## 2017-05-01 MED ORDER — DILTIAZEM HCL ER COATED BEADS 300 MG PO CP24: 300.0000 mg | ORAL_CAPSULE | Freq: Every day | ORAL | 0 refills | Status: AC

## 2017-05-07 DIAGNOSIS — N529 Male erectile dysfunction, unspecified: Secondary | ICD-10-CM | POA: Diagnosis not present

## 2017-05-07 DIAGNOSIS — R739 Hyperglycemia, unspecified: Secondary | ICD-10-CM | POA: Diagnosis not present

## 2017-05-07 DIAGNOSIS — N5231 Erectile dysfunction following radical prostatectomy: Secondary | ICD-10-CM | POA: Diagnosis not present

## 2017-05-07 DIAGNOSIS — E785 Hyperlipidemia, unspecified: Secondary | ICD-10-CM | POA: Diagnosis not present

## 2017-05-07 DIAGNOSIS — Z5320 Procedure and treatment not carried out because of patient's decision for unspecified reasons: Secondary | ICD-10-CM | POA: Diagnosis not present

## 2017-05-07 DIAGNOSIS — Z23 Encounter for immunization: Secondary | ICD-10-CM | POA: Diagnosis not present

## 2017-05-07 DIAGNOSIS — Z9079 Acquired absence of other genital organ(s): Secondary | ICD-10-CM | POA: Diagnosis not present

## 2017-05-07 DIAGNOSIS — I10 Essential (primary) hypertension: Secondary | ICD-10-CM | POA: Diagnosis not present

## 2017-05-07 DIAGNOSIS — Z8546 Personal history of malignant neoplasm of prostate: Secondary | ICD-10-CM | POA: Diagnosis not present

## 2017-05-07 DIAGNOSIS — Z79899 Other long term (current) drug therapy: Secondary | ICD-10-CM | POA: Diagnosis not present

## 2017-06-24 ENCOUNTER — Other Ambulatory Visit: Payer: Self-pay

## 2017-06-24 MED ORDER — ATORVASTATIN CALCIUM 20 MG PO TABS: 20.0000 mg | ORAL_TABLET | Freq: Every day | ORAL | 0 refills | Status: AC

## 2017-07-10 DIAGNOSIS — Z9079 Acquired absence of other genital organ(s): Secondary | ICD-10-CM | POA: Diagnosis not present

## 2017-07-10 DIAGNOSIS — Z8546 Personal history of malignant neoplasm of prostate: Secondary | ICD-10-CM | POA: Diagnosis not present

## 2017-07-10 DIAGNOSIS — N529 Male erectile dysfunction, unspecified: Secondary | ICD-10-CM | POA: Diagnosis not present

## 2017-11-12 DIAGNOSIS — L570 Actinic keratosis: Secondary | ICD-10-CM | POA: Diagnosis not present

## 2017-11-12 DIAGNOSIS — E785 Hyperlipidemia, unspecified: Secondary | ICD-10-CM | POA: Diagnosis not present

## 2017-11-12 DIAGNOSIS — I10 Essential (primary) hypertension: Secondary | ICD-10-CM | POA: Diagnosis not present

## 2017-11-12 DIAGNOSIS — M7989 Other specified soft tissue disorders: Secondary | ICD-10-CM | POA: Diagnosis not present

## 2017-11-12 DIAGNOSIS — Z7689 Persons encountering health services in other specified circumstances: Secondary | ICD-10-CM | POA: Diagnosis not present

## 2017-11-12 DIAGNOSIS — N529 Male erectile dysfunction, unspecified: Secondary | ICD-10-CM | POA: Diagnosis not present

## 2017-11-12 DIAGNOSIS — Z8546 Personal history of malignant neoplasm of prostate: Secondary | ICD-10-CM | POA: Diagnosis not present

## 2017-11-14 DIAGNOSIS — N5201 Erectile dysfunction due to arterial insufficiency: Secondary | ICD-10-CM | POA: Diagnosis not present

## 2017-12-03 DIAGNOSIS — F322 Major depressive disorder, single episode, severe without psychotic features: Secondary | ICD-10-CM | POA: Diagnosis not present

## 2017-12-06 DIAGNOSIS — Z8546 Personal history of malignant neoplasm of prostate: Secondary | ICD-10-CM | POA: Diagnosis not present

## 2017-12-06 DIAGNOSIS — N5231 Erectile dysfunction following radical prostatectomy: Secondary | ICD-10-CM | POA: Diagnosis not present

## 2017-12-11 DIAGNOSIS — Z23 Encounter for immunization: Secondary | ICD-10-CM | POA: Diagnosis not present

## 2017-12-11 DIAGNOSIS — I10 Essential (primary) hypertension: Secondary | ICD-10-CM | POA: Diagnosis not present

## 2017-12-25 DIAGNOSIS — Z85828 Personal history of other malignant neoplasm of skin: Secondary | ICD-10-CM | POA: Diagnosis not present

## 2017-12-25 DIAGNOSIS — L570 Actinic keratosis: Secondary | ICD-10-CM | POA: Diagnosis not present

## 2017-12-25 DIAGNOSIS — L814 Other melanin hyperpigmentation: Secondary | ICD-10-CM | POA: Diagnosis not present

## 2017-12-25 DIAGNOSIS — D1800 Hemangioma unspecified site: Secondary | ICD-10-CM | POA: Diagnosis not present

## 2017-12-25 DIAGNOSIS — I781 Nevus, non-neoplastic: Secondary | ICD-10-CM | POA: Diagnosis not present

## 2017-12-25 DIAGNOSIS — L821 Other seborrheic keratosis: Secondary | ICD-10-CM | POA: Diagnosis not present

## 2018-01-03 DIAGNOSIS — N529 Male erectile dysfunction, unspecified: Secondary | ICD-10-CM | POA: Diagnosis not present

## 2018-01-03 DIAGNOSIS — N5201 Erectile dysfunction due to arterial insufficiency: Secondary | ICD-10-CM | POA: Diagnosis not present

## 2018-01-03 DIAGNOSIS — Z8546 Personal history of malignant neoplasm of prostate: Secondary | ICD-10-CM | POA: Diagnosis not present

## 2018-01-03 DIAGNOSIS — N393 Stress incontinence (female) (male): Secondary | ICD-10-CM | POA: Diagnosis not present

## 2018-01-08 DIAGNOSIS — N529 Male erectile dysfunction, unspecified: Secondary | ICD-10-CM | POA: Diagnosis not present

## 2018-01-08 DIAGNOSIS — F320 Major depressive disorder, single episode, mild: Secondary | ICD-10-CM | POA: Diagnosis not present

## 2018-01-08 DIAGNOSIS — I10 Essential (primary) hypertension: Secondary | ICD-10-CM | POA: Diagnosis not present

## 2018-01-08 DIAGNOSIS — Z8546 Personal history of malignant neoplasm of prostate: Secondary | ICD-10-CM | POA: Diagnosis not present

## 2018-02-11 DIAGNOSIS — F320 Major depressive disorder, single episode, mild: Secondary | ICD-10-CM | POA: Diagnosis not present

## 2018-02-14 DIAGNOSIS — M5412 Radiculopathy, cervical region: Secondary | ICD-10-CM | POA: Diagnosis not present

## 2018-02-14 DIAGNOSIS — Z8546 Personal history of malignant neoplasm of prostate: Secondary | ICD-10-CM | POA: Diagnosis not present

## 2018-02-14 DIAGNOSIS — N529 Male erectile dysfunction, unspecified: Secondary | ICD-10-CM | POA: Diagnosis not present

## 2018-02-14 DIAGNOSIS — Z79899 Other long term (current) drug therapy: Secondary | ICD-10-CM | POA: Diagnosis not present

## 2018-02-14 DIAGNOSIS — I10 Essential (primary) hypertension: Secondary | ICD-10-CM | POA: Diagnosis not present

## 2018-02-14 DIAGNOSIS — Z9079 Acquired absence of other genital organ(s): Secondary | ICD-10-CM | POA: Diagnosis not present

## 2018-02-24 DIAGNOSIS — I10 Essential (primary) hypertension: Secondary | ICD-10-CM | POA: Diagnosis not present

## 2018-02-24 DIAGNOSIS — Z9079 Acquired absence of other genital organ(s): Secondary | ICD-10-CM | POA: Diagnosis not present

## 2018-02-24 DIAGNOSIS — M5412 Radiculopathy, cervical region: Secondary | ICD-10-CM | POA: Diagnosis not present

## 2018-02-24 DIAGNOSIS — Z79899 Other long term (current) drug therapy: Secondary | ICD-10-CM | POA: Diagnosis not present

## 2018-02-24 DIAGNOSIS — Z8546 Personal history of malignant neoplasm of prostate: Secondary | ICD-10-CM | POA: Diagnosis not present

## 2018-02-24 DIAGNOSIS — N529 Male erectile dysfunction, unspecified: Secondary | ICD-10-CM | POA: Diagnosis not present

## 2018-02-25 DIAGNOSIS — Z9079 Acquired absence of other genital organ(s): Secondary | ICD-10-CM | POA: Diagnosis not present

## 2018-02-25 DIAGNOSIS — M5412 Radiculopathy, cervical region: Secondary | ICD-10-CM | POA: Diagnosis not present

## 2018-02-25 DIAGNOSIS — I10 Essential (primary) hypertension: Secondary | ICD-10-CM | POA: Diagnosis not present

## 2018-02-25 DIAGNOSIS — Z79899 Other long term (current) drug therapy: Secondary | ICD-10-CM | POA: Diagnosis not present

## 2018-02-25 DIAGNOSIS — N529 Male erectile dysfunction, unspecified: Secondary | ICD-10-CM | POA: Diagnosis not present

## 2018-02-25 DIAGNOSIS — Z8546 Personal history of malignant neoplasm of prostate: Secondary | ICD-10-CM | POA: Diagnosis not present
